# Patient Record
Sex: Male | Born: 1935 | Race: White | Hispanic: No | Smoking: Former smoker
Health system: Southern US, Community
[De-identification: ages and names within clinical notes are randomized; demographics above are authoritative.]

## PROBLEM LIST (undated history)

## (undated) DIAGNOSIS — R0600 Dyspnea, unspecified: Secondary | ICD-10-CM

## (undated) DIAGNOSIS — E7800 Pure hypercholesterolemia, unspecified: Secondary | ICD-10-CM

## (undated) DIAGNOSIS — Z950 Presence of cardiac pacemaker: Secondary | ICD-10-CM

## (undated) DIAGNOSIS — J302 Other seasonal allergic rhinitis: Secondary | ICD-10-CM

## (undated) DIAGNOSIS — M109 Gout, unspecified: Secondary | ICD-10-CM

## (undated) DIAGNOSIS — H9193 Unspecified hearing loss, bilateral: Secondary | ICD-10-CM

## (undated) DIAGNOSIS — H3530 Unspecified macular degeneration: Secondary | ICD-10-CM

## (undated) DIAGNOSIS — R011 Cardiac murmur, unspecified: Secondary | ICD-10-CM

## (undated) DIAGNOSIS — N400 Enlarged prostate without lower urinary tract symptoms: Secondary | ICD-10-CM

## (undated) DIAGNOSIS — I350 Nonrheumatic aortic (valve) stenosis: Secondary | ICD-10-CM

## (undated) DIAGNOSIS — M1990 Unspecified osteoarthritis, unspecified site: Secondary | ICD-10-CM

## (undated) DIAGNOSIS — I517 Cardiomegaly: Secondary | ICD-10-CM

## (undated) DIAGNOSIS — R55 Syncope and collapse: Secondary | ICD-10-CM

## (undated) DIAGNOSIS — C439 Malignant melanoma of skin, unspecified: Secondary | ICD-10-CM

## (undated) DIAGNOSIS — I3481 Nonrheumatic mitral (valve) annulus calcification: Secondary | ICD-10-CM

## (undated) DIAGNOSIS — I495 Sick sinus syndrome: Secondary | ICD-10-CM

## (undated) DIAGNOSIS — I10 Essential (primary) hypertension: Secondary | ICD-10-CM

## (undated) DIAGNOSIS — I4430 Unspecified atrioventricular block: Secondary | ICD-10-CM

## (undated) DIAGNOSIS — M7989 Other specified soft tissue disorders: Secondary | ICD-10-CM

## (undated) DIAGNOSIS — R0602 Shortness of breath: Secondary | ICD-10-CM

## (undated) DIAGNOSIS — I059 Rheumatic mitral valve disease, unspecified: Secondary | ICD-10-CM

## (undated) DIAGNOSIS — D649 Anemia, unspecified: Secondary | ICD-10-CM

## (undated) HISTORY — DX: Cardiac murmur, unspecified: R01.1

## (undated) HISTORY — DX: Essential (primary) hypertension: I10

## (undated) HISTORY — DX: Benign prostatic hyperplasia without lower urinary tract symptoms: N40.0

## (undated) HISTORY — DX: Dyspnea, unspecified: R06.00

## (undated) HISTORY — DX: Anemia, unspecified: D64.9

## (undated) HISTORY — PX: OTHER SURGICAL HISTORY: SHX169

## (undated) HISTORY — DX: Cardiomegaly: I51.7

## (undated) HISTORY — DX: Unspecified atrioventricular block: I44.30

## (undated) HISTORY — DX: Malignant melanoma of skin, unspecified: C43.9

## (undated) HISTORY — DX: Nonrheumatic aortic (valve) stenosis: I35.0

## (undated) HISTORY — DX: Syncope and collapse: R55

## (undated) HISTORY — DX: Gout, unspecified: M10.9

## (undated) HISTORY — DX: Nonrheumatic mitral (valve) annulus calcification: I34.81

## (undated) HISTORY — DX: Unspecified osteoarthritis, unspecified site: M19.90

## (undated) HISTORY — DX: Unspecified macular degeneration: H35.30

## (undated) HISTORY — DX: Shortness of breath: R06.02

## (undated) HISTORY — DX: Other specified soft tissue disorders: M79.89

## (undated) HISTORY — DX: Unspecified hearing loss, bilateral: H91.93

## (undated) HISTORY — DX: Presence of cardiac pacemaker: Z95.0

## (undated) HISTORY — DX: Sick sinus syndrome: I49.5

## (undated) HISTORY — DX: Pure hypercholesterolemia, unspecified: E78.00

## (undated) HISTORY — DX: Rheumatic mitral valve disease, unspecified: I05.9

## (undated) HISTORY — DX: Other seasonal allergic rhinitis: J30.2

---

## 1999-01-23 HISTORY — PX: TRANSURETHRAL RESECTION OF PROSTATE: SHX73

## 1999-09-21 ENCOUNTER — Ambulatory Visit (HOSPITAL_COMMUNITY): Admission: RE | Admit: 1999-09-21 | Discharge: 1999-09-21 | Payer: Self-pay | Admitting: Urology

## 2004-01-23 HISTORY — PX: TOTAL KNEE ARTHROPLASTY: SHX125

## 2004-10-02 ENCOUNTER — Inpatient Hospital Stay (HOSPITAL_COMMUNITY): Admission: RE | Admit: 2004-10-02 | Discharge: 2004-10-06 | Payer: Self-pay | Admitting: Orthopedic Surgery

## 2004-12-26 ENCOUNTER — Ambulatory Visit (HOSPITAL_COMMUNITY): Admission: RE | Admit: 2004-12-26 | Discharge: 2004-12-26 | Payer: Self-pay | Admitting: Gastroenterology

## 2006-01-22 DIAGNOSIS — R55 Syncope and collapse: Secondary | ICD-10-CM

## 2006-01-22 HISTORY — DX: Syncope and collapse: R55

## 2006-05-14 ENCOUNTER — Inpatient Hospital Stay (HOSPITAL_COMMUNITY): Admission: EM | Admit: 2006-05-14 | Discharge: 2006-05-16 | Payer: Self-pay | Admitting: Emergency Medicine

## 2006-05-15 HISTORY — PX: PACEMAKER INSERTION: SHX728

## 2010-10-09 ENCOUNTER — Ambulatory Visit (HOSPITAL_COMMUNITY)
Admission: RE | Admit: 2010-10-09 | Discharge: 2010-10-09 | Disposition: A | Discharge status: Home / Self Care | Payer: Medicare Other | Source: Ambulatory Visit | Attending: Interventional Cardiology | Admitting: Interventional Cardiology

## 2010-10-09 DIAGNOSIS — R0602 Shortness of breath: Secondary | ICD-10-CM | POA: Insufficient documentation

## 2010-10-09 LAB — PULMONARY FUNCTION TEST

## 2010-11-22 ENCOUNTER — Institutional Professional Consult (permissible substitution): Payer: BLUE CROSS/BLUE SHIELD | Admitting: Internal Medicine

## 2010-11-23 ENCOUNTER — Ambulatory Visit (INDEPENDENT_AMBULATORY_CARE_PROVIDER_SITE_OTHER): Payer: Medicare Other | Admitting: Internal Medicine

## 2010-11-23 ENCOUNTER — Encounter: Payer: Self-pay | Admitting: Internal Medicine

## 2010-11-23 VITALS — BP 148/80 | HR 66 | Temp 97.9°F | Ht 73.0 in | Wt 264.4 lb

## 2010-11-23 DIAGNOSIS — R0609 Other forms of dyspnea: Secondary | ICD-10-CM

## 2010-11-23 DIAGNOSIS — R0600 Dyspnea, unspecified: Secondary | ICD-10-CM

## 2010-11-23 NOTE — Patient Instructions (Signed)
Try prilosec 20mg   Take 30-60 min before first meal of the day and Pepcid 20 mg one bedtime until return  GERD (REFLUX)  is an extremely common cause of respiratory symptoms, many times with no significant heartburn at all.    It can be treated with medication, but also with lifestyle changes including avoidance of late meals, excessive alcohol, smoking cessation, and avoid fatty foods, chocolate, peppermint, colas, red wine, and acidic juices such as orange juice.  NO MINT OR MENTHOL PRODUCTS SO NO COUGH DROPS  USE SUGARLESS CANDY INSTEAD (jolley ranchers or Stover's)  NO OIL BASED VITAMINS - use powdered substitutes.    Work on inhaler technique:  relax and gently blow all the way out then take a nice smooth deep breath back in, triggering the inhaler at same time you start breathing in.  Hold for up to 5 seconds if you can.  Rinse and gargle with water when done   If your mouth or throat starts to bother you,   I suggest you time the inhaler to your dental care and after using the inhaler(s) brush teeth and tongue with a baking soda containing toothpaste and when you rinse this out, gargle with it first to see if this helps your mouth and throat.    Use xopenex 2 puffs every 4 hours if needed to improve your activity tolerance  Use Zyrtec as needed for itching and sneezing  Ok to stop qvar and shots  Please schedule a follow up office visit in 4 weeks, sooner if needed

## 2010-11-23 NOTE — Progress Notes (Signed)
  Subjective:    Patient ID: Aaron [name], male    DOB: 03-07-1929, 77 y.o.   MRN: [id number]  HPI 25 yowm occ cigar smoker with new onset doe since around 2008 eval by Dr Mendel Ryder and referred to pulmonary clinic 11/23/2010   11/23/2010 Initial pulmonary office eval in EMR era cc progressive doe x 4 years to point where ok only does ok as long  walk as walk slow but summer of 2012  able to walk a 1 mile in about 25 min where used to be able to do a mile in 17  Sometimes sob  car to house now if goes fast.  No problem at rest sitting or sleeping.  Already eval for allergies asthma by Dr Jethro Bolus  and placed on shots and advair and qvar and no better breathing x maybe the nasal symptoms on zyrtec but really not sure.  No active sneezing or itching, daughter thinks she hears him wheeze but he's not aware of it. Denies childhool h/o asthma or eczema or allergies. Doesn't know what exactly he's allergic to but on shots for over 2 years with no change in symptoms. No seasonal variation  Sleeping ok without nocturnal  or early am exacerbation  of respiratory  c/o's or need for noct saba. Also denies any obvious fluctuation of symptoms with weather or environmental changes or other aggravating or alleviating factors except as outlined above       Review of Systems  Constitutional: Negative for fever and unexpected weight change.  HENT: Positive for congestion. Negative for ear pain, nosebleeds, sore throat, rhinorrhea, sneezing, trouble swallowing, dental problem, postnasal drip and sinus pressure.   Eyes: Negative for redness and itching.  Respiratory: Positive for shortness of breath. Negative for cough, chest tightness and wheezing.   Cardiovascular: Negative for palpitations and leg swelling.  Gastrointestinal: Negative for nausea and vomiting.  Genitourinary: Negative for dysuria.  Musculoskeletal: Positive for joint swelling and arthralgias.  Skin: Negative for rash.  Neurological: Negative  for headaches.  Hematological: Does not bruise/bleed easily.  Psychiatric/Behavioral: Negative for dysphoric mood. The patient is not nervous/anxious.        Objective:   Physical Exam  11/23/2010  264  HEENT mild turbinate edema.  Oropharynx no thrush or excess pnd or cobblestoning.  No JVD or cervical adenopathy. Mild accessory muscle hypertrophy. Trachea midline, nl thryroid. Chest was hyperinflated by percussion with diminished breath sounds and moderate increased exp time without wheeze. Hoover sign positive at mid inspiration. Regular rate and rhythm without murmur gallop or rub or increase P2 or edema.  Abd:mod obese but no hsm, limited excursion in supine position. Ext warm without cyanosis or clubbing.        Assessment & Plan:

## 2010-11-24 DIAGNOSIS — R0600 Dyspnea, unspecified: Secondary | ICD-10-CM | POA: Insufficient documentation

## 2010-11-24 MED ORDER — LEVALBUTEROL TARTRATE 45 MCG/ACT IN AERO: 1.0000 | INHALATION_SPRAY | RESPIRATORY_TRACT | Status: DC | PRN

## 2010-11-24 NOTE — Assessment & Plan Note (Signed)
Symptoms are markedly disproportionate to objective findings and not clear this is a lung problem but pt does appear to have difficult airway management issues.  DDX of  difficult airways managment all start with A and  include Adherence, Ace Inhibitors, Acid Reflux, Active Sinus Disease, Alpha 1 Antitripsin deficiency, Anxiety masquerading as Airways dz,  ABPA,  allergy(esp in young), Aspiration (esp in elderly), Adverse effects of DPI,  Active smokers, plus two Bs  = Bronchiectasis and Beta blocker use..and one C= CHF  Adherence is always the initial "prime suspect" and is a multilayered concern that requires a "trust but verify" approach in every patient - starting with knowing how to use medications, especially inhalers, correctly, keeping up with refills and understanding the fundamental difference between maintenance and prns vs those medications only taken for a very short course and then stopped and not refilled.  The proper method of use, as well as anticipated side effects, of this metered-dose inhaler are discussed and demonstrated to the patient. Improved from < 25 % effective to 50% effective p coaching so doubt he was getting any hfa in anyway which calls into question whether he even has any asthma  ? Allergies > on shots for > 2 year, would rec trial off  ? Acid reflux with  Classic Upper airway cough syndrome, so named because it's frequently impossible to sort out how much is  CR/sinusitis with freq throat clearing (which can be related to primary GERD)   vs  causing  secondary (" extra esophageal")  GERD from wide swings in gastric pressure that occur with throat clearing, often  promoting self use of mint and menthol lozenges that reduce the lower esophageal sphincter tone and exacerbate the problem further in a cyclical fashion.   These are the same pts who not infrequently have failed to tolerate ace inhibitors,  dry powder inhalers or biphosphonates or report having reflux symptoms  that don't respond to standard doses of PPI , and are easily confused as having aecopd or asthma flares,   For now rx as gerd and f/u in 4 weeks  See instructions for specific recommendations which were reviewed directly with the patient who was given a copy with highlighter outlining the key components.

## 2010-11-29 ENCOUNTER — Encounter: Payer: Self-pay | Admitting: Internal Medicine

## 2010-12-19 ENCOUNTER — Ambulatory Visit (INDEPENDENT_AMBULATORY_CARE_PROVIDER_SITE_OTHER): Payer: Medicare Other | Admitting: Internal Medicine

## 2010-12-19 ENCOUNTER — Encounter: Payer: Self-pay | Admitting: Internal Medicine

## 2010-12-19 ENCOUNTER — Ambulatory Visit (INDEPENDENT_AMBULATORY_CARE_PROVIDER_SITE_OTHER)
Admission: RE | Admit: 2010-12-19 | Discharge: 2010-12-19 | Disposition: A | Discharge status: Home / Self Care | Payer: Medicare Other | Source: Ambulatory Visit | Attending: Internal Medicine | Admitting: Internal Medicine

## 2010-12-19 ENCOUNTER — Other Ambulatory Visit (INDEPENDENT_AMBULATORY_CARE_PROVIDER_SITE_OTHER): Payer: Medicare Other

## 2010-12-19 VITALS — BP 170/82 | HR 66 | Temp 97.6°F | Ht 73.0 in | Wt 264.2 lb

## 2010-12-19 DIAGNOSIS — R0609 Other forms of dyspnea: Secondary | ICD-10-CM

## 2010-12-19 DIAGNOSIS — R0600 Dyspnea, unspecified: Secondary | ICD-10-CM

## 2010-12-19 LAB — CBC WITH DIFFERENTIAL/PLATELET
Basophils Absolute: 0 10*3/uL (ref 0.0–0.1)
Basophils Relative: 0.3 % (ref 0.0–3.0)
Hemoglobin: 13.2 g/dL (ref 13.0–17.0)
Lymphs Abs: 2.4 10*3/uL (ref 0.7–4.0)
Monocytes Absolute: 1 10*3/uL (ref 0.1–1.0)
Neutro Abs: 8 10*3/uL — ABNORMAL HIGH (ref 1.4–7.7)
RBC: 4.08 Mil/uL — ABNORMAL LOW (ref 4.22–5.81)
RDW: 15.5 % — ABNORMAL HIGH (ref 11.5–14.6)
WBC: 11.4 10*3/uL — ABNORMAL HIGH (ref 4.5–10.5)

## 2010-12-19 LAB — BASIC METABOLIC PANEL
BUN: 29 mg/dL — ABNORMAL HIGH (ref 6–23)
CO2: 24 mEq/L (ref 19–32)
Creatinine, Ser: 1 mg/dL (ref 0.4–1.5)
GFR: 76.49 mL/min (ref >60.00)

## 2010-12-19 MED ORDER — NEBIVOLOL HCL 5 MG PO TABS: 5.0000 mg | ORAL_TABLET | Freq: Every day | ORAL | Status: DC

## 2010-12-19 NOTE — Progress Notes (Signed)
  Subjective:    Patient ID: Aaron [name], male    DOB: 02/21/1929, 77 y.o.   MRN: [id number]  HPI 19 yowm occ cigar smoker in past  with new onset doe since around 2008 eval by Dr Mendel Ryder and referred to pulmonary clinic 11/23/2010   11/23/2010 Initial pulmonary office eval in EMR era cc progressive doe x 4 years to point where ok only does ok as long  walk as walk slow but summer of 2012  able to walk a 1 mile in about 25 min where used to be able to do a mile in 17  Sometimes sob  car to house now if goes fast.  No problem at rest sitting or sleeping.  Already eval for allergies asthma by Dr Jethro Bolus  and placed on shots and advair and qvar and no better breathing x maybe the nasal symptoms on zyrtec but really not sure.  No active sneezing or itching, daughter thinks she hears him wheeze but he's not aware of it. Denies childhool h/o asthma or eczema or allergies. Doesn't know what exactly he's allergic to but on shots for over 2 years with no change in symptoms. No seasonal variation rec Try prilosec 20mg   Take 30-60 min before first meal of the day and Pepcid 20 mg one bedtime until return GERD diet Work on inhaler technique: .   Use xopenex 2 puffs every 4 hours if needed to improve your activity tolerance Use Zyrtec as needed for itching and sneezing Ok to stop qvar and shots     12/19/2010 f/u ov/Brennan Karam cc no change doe on ppi/h2 hs but hasn't tried using xopenex before activity as rec. No sign cough, no variability.     Sleeping ok without nocturnal  or early am exacerbation  of respiratory  c/o's or need for noct saba. Also denies any obvious fluctuation of symptoms with weather or environmental changes or other aggravating or alleviating factors except as outlined above    ROS  At present neg for  any significant sore throat, dysphagia, itching, sneezing,  nasal congestion or excess/ purulent secretions,  fever, chills, sweats, unintended wt loss, pleuritic or exertional cp,  hempoptysis, orthopnea pnd or leg swelling.  Also denies presyncope, palpitations, heartburn, abdominal pain, nausea, vomiting, diarrhea  or change in bowel or urinary habits, dysuria,hematuria,  rash, arthralgias, visual complaints, headache, numbness weakness or ataxia.               Objective:   Physical Exam  11/23/2010  264  > 12/19/2010  264  HEENT mild turbinate edema.  Oropharynx no thrush or excess pnd or cobblestoning.  No JVD or cervical adenopathy. Mild accessory muscle hypertrophy. Trachea midline, nl thryroid. Chest was hyperinflated by percussion with diminished breath sounds and moderate increased exp time without wheeze. Hoover sign positive at mid inspiration. Regular rate and rhythm without murmur gallop or rub or increase P2 or edema.  Abd:mod obese but no hsm, limited excursion in supine position. Ext warm without cyanosis or clubbing.    12/19/10 cxr Stable dual lead pacemaker. No acute cardiopulmonary disease.  BNP, TSH BMET and CBC all wnl     Assessment & Plan:

## 2010-12-19 NOTE — Patient Instructions (Signed)
Work on inhaler technique:  relax and gently blow all the way out then take a nice smooth deep breath back in, triggering the inhaler at same time you start breathing in.  Hold for up to 5 seconds if you can.  Rinse and gargle with water when done   If your mouth or throat starts to bother you,   I suggest you time the inhaler to your dental care and after using the inhaler(s) brush teeth and tongue with a baking soda containing toothpaste and when you rinse this out, gargle with it first to see if this helps your mouth and throat.   Use the xopenex 15 min before planned activity to see if helps your activity tolerance    Ok to stop the acid suppression at this point to see if it makes any difference in your symptoms  Add bystolic 5 mg one daily to help lower your blood pressure

## 2010-12-19 NOTE — Assessment & Plan Note (Addendum)
-   2d Echo 08/25/10 diastolic dysfunction, increased LAD    - PFT's 10/09/10 minimal airflow obstruction    - HFA 50% p coaching  11/24/10 > 75% 12/19/2010     - 12/19/2010  Walked RA x 3 laps @ 185 ft each stopped due to  End of study, no desat  Most of his problem is likely obesty, deconditioning and ? Ex related  diastolic dysfunction (noting BP up today though bnp only 105)  on norvasc alone.  Since no def asthma rec a trial of BB and Strongly prefer in this setting: Bystolic, the most beta -1  selective Beta blocker available in sample form, with bisoprolol the most selective generic choice  on the market. Will try adding low dose bystolic given HBP at rest today.  See instructions for specific recommendations which were reviewed directly with the patient who was given a copy with highlighter outlining the key components.

## 2010-12-21 ENCOUNTER — Telehealth: Payer: Self-pay | Admitting: Internal Medicine

## 2010-12-21 NOTE — Telephone Encounter (Signed)
Spoke with pt and notified of results per Dr. Wert. Pt verbalized understanding and denied any questions. 

## 2010-12-21 NOTE — Progress Notes (Signed)
Quick Note:  Spoke with pt and notified of results per Dr. Wert. Pt verbalized understanding and denied any questions.  ______ 

## 2011-01-09 ENCOUNTER — Encounter: Payer: Self-pay | Admitting: Internal Medicine

## 2011-01-09 ENCOUNTER — Ambulatory Visit (INDEPENDENT_AMBULATORY_CARE_PROVIDER_SITE_OTHER): Payer: Medicare Other | Admitting: Internal Medicine

## 2011-01-09 VITALS — BP 160/80 | HR 125 | Temp 97.6°F | Ht 73.0 in | Wt 263.8 lb

## 2011-01-09 DIAGNOSIS — R0600 Dyspnea, unspecified: Secondary | ICD-10-CM

## 2011-01-09 DIAGNOSIS — R0989 Other specified symptoms and signs involving the circulatory and respiratory systems: Secondary | ICD-10-CM

## 2011-01-09 MED ORDER — NEBIVOLOL HCL 10 MG PO TABS: 10.0000 mg | ORAL_TABLET | Freq: Every day | ORAL | Status: AC

## 2011-01-09 MED ORDER — BUDESONIDE-FORMOTEROL FUMARATE 160-4.5 MCG/ACT IN AERO: INHALATION_SPRAY | RESPIRATORY_TRACT | Status: DC

## 2011-01-09 NOTE — Progress Notes (Signed)
Subjective:    Patient ID: Aaron [name], male    DOB: [birth year], 77 y.o.   MRN: [id number]  HPI 70 yowm occ cigar smoker in past  with new onset doe since around 2008 eval by Dr Mendel Ryder and referred to pulmonary clinic 11/23/2010   11/23/2010 Initial pulmonary office eval in EMR era cc progressive doe x 4 years to point where ok only does ok as long  walk as walk slow but summer of 2012  able to walk a 1 mile in about 25 min where used to be able to do a mile in 17  10 years ago.  Sometimes sob  car to house now if goes fast.  No problem at rest sitting or sleeping.  Already eval for allergies asthma by Dr Jethro Bolus  and placed on shots and advair and qvar and no better breathing x maybe the nasal symptoms on zyrtec but really not sure.  No active sneezing or itching, daughter thinks she hears him wheeze but he's not aware of it. Denies childhood did have allergic reaction to fish/ dust/ and did go on shots as child and seemed help.  Doesn't know what exactly he's allergic to but on shots for over 2 years with no change in symptoms. No seasonal variation rec Try prilosec 20mg   Take 30-60 min before first meal of the day and Pepcid 20 mg one bedtime until return GERD diet Work on inhaler technique: .   Use xopenex 2 puffs every 4 hours if needed to improve your activity tolerance Use Zyrtec as needed for itching and sneezing Ok to stop qvar and shots     12/19/2010 f/u ov/Letia Guidry cc no change doe on ppi/h2 hs but hasn't tried using xopenex before activity as rec. No sign cough, no variability.   rec Work on inhaler technique: Use the xopenex 15 min before planned activity to see if helps your activity tolerance   Ok to stop the acid suppression at this point to see if it makes any difference in your symptoms Add bystolic 5 mg one daily to help lower your blood pressure   01/09/2011 f/u ov/Artina Minella cc doe about the same as last visit. Denies cough,  chest pain, chest tightness, and wheezing.  Does feel xopenex before activity helps, no worse off acid suppression   Sleeping ok without nocturnal  or early am exacerbation  of respiratory  c/o's or need for noct saba. Also denies any obvious fluctuation of symptoms with weather or environmental changes or other aggravating or alleviating factors except as outlined above    ROS  At present neg for  any significant sore throat, dysphagia, itching, sneezing,  nasal congestion or excess/ purulent secretions,  fever, chills, sweats, unintended wt loss, pleuritic or exertional cp, hempoptysis, orthopnea pnd or leg swelling.  Also denies presyncope, palpitations, heartburn, abdominal pain, nausea, vomiting, diarrhea  or change in bowel or urinary habits, dysuria,hematuria,  rash, arthralgias, visual complaints, headache, numbness weakness or ataxia.               Objective:   Physical Exam  11/23/2010  264  > 12/19/2010  264 > 01/09/2011  263 HEENT mild turbinate edema.  Oropharynx no thrush or excess pnd or cobblestoning.  No JVD or cervical adenopathy. Mild accessory muscle hypertrophy. Trachea midline, nl thryroid. Chest was hyperinflated by percussion with diminished breath sounds and moderate increased exp time without wheeze. Hoover sign positive at mid inspiration. Regular rate and rhythm without murmur gallop  or rub or increase P2 or edema.  Abd:mod obese but no hsm, limited excursion in supine position. Ext warm without cyanosis or clubbing.    12/19/10 cxr Stable dual lead pacemaker. No acute cardiopulmonary disease.  BNP, TSH BMET and CBC all wnl     Assessment & Plan:

## 2011-01-09 NOTE — Patient Instructions (Addendum)
Increase the bystolic to 10 mg one daily  Start symbicort 160 Take 2 puffs first thing in am and then another 2 puffs about 12 hours later.    Work on inhaler technique:  relax and gently blow all the way out then take a nice smooth deep breath back in, triggering the inhaler at same time you start breathing in.  Hold for up to 5 seconds if you can.  Rinse and gargle with water when done   If your mouth or throat starts to bother you,   I suggest you time the inhaler to your dental care and after using the inhaler(s) brush teeth and tongue with a baking soda containing toothpaste and when you rinse this out, gargle with it first to see if this helps your mouth and throat.     Please schedule a follow up office visit in 4 weeks, sooner if needed   ADD ? CPST next option ?

## 2011-01-11 NOTE — Assessment & Plan Note (Signed)
He's convinced he's better p xopenex so will try symbicort for ? Occult asthma but he does not have copd and one issue is whether this could be occult diastolic dysfunction/ cardiac asthma he's experiencing so increase bystolic to 10 mg per day and consider cpst if dx remains in doubt  The proper method of use, as well as anticipated side effects, of this metered-dose inhaler are discussed and demonstrated to the patient. Improved to 75% with coaching.

## 2011-02-08 ENCOUNTER — Ambulatory Visit (INDEPENDENT_AMBULATORY_CARE_PROVIDER_SITE_OTHER): Payer: Medicare Other | Admitting: Internal Medicine

## 2011-02-08 ENCOUNTER — Encounter: Payer: Self-pay | Admitting: Internal Medicine

## 2011-02-08 VITALS — BP 140/68 | HR 60 | Temp 98.0°F | Ht 73.0 in | Wt 267.0 lb

## 2011-02-08 DIAGNOSIS — J45909 Unspecified asthma, uncomplicated: Secondary | ICD-10-CM

## 2011-02-08 DIAGNOSIS — R0600 Dyspnea, unspecified: Secondary | ICD-10-CM

## 2011-02-08 DIAGNOSIS — I10 Essential (primary) hypertension: Secondary | ICD-10-CM

## 2011-02-08 DIAGNOSIS — R0609 Other forms of dyspnea: Secondary | ICD-10-CM

## 2011-02-08 MED ORDER — PREDNISONE (PAK) 10 MG PO TABS: ORAL_TABLET | ORAL | Status: AC

## 2011-02-08 MED ORDER — MOMETASONE FURO-FORMOTEROL FUM 200-5 MCG/ACT IN AERO: INHALATION_SPRAY | RESPIRATORY_TRACT | Status: DC

## 2011-02-08 NOTE — Progress Notes (Signed)
Subjective:    Patient ID: Aaron [name], male    DOB: 07/26/1929.   MRN: [id number]  HPI  Brief patient profile:  77 yowm occ cigar smoker in past  with new onset doe since around 2008 eval by Dr Mendel Ryder and referred to pulmonary clinic 11/23/2010   11/23/2010 Initial pulmonary office eval in EMR era cc progressive doe x 4 years to point where ok only does ok as long  walk as walk slow but summer of 2012  able to walk a 1 mile in about 25 min where used to be able to do a mile in 17  10 years ago.  Sometimes sob  car to house now if goes fast.  No problem at rest sitting or sleeping.  Already eval for allergies asthma by Dr Jethro Bolus  and placed on shots and advair and qvar and no better breathing x maybe the nasal symptoms on zyrtec but really not sure.  No active sneezing or itching, daughter thinks she hears him wheeze but he's not aware of it. Denies childhood did have allergic reaction to fish/ dust/ and did go on shots as child and seemed help. Doesn't know what exactly he's allergic to but on shots for over 2 years with no change in symptoms. No seasonal variation rec Try prilosec 20mg   Take 30-60 min before first meal of the day and Pepcid 20 mg one bedtime until return GERD diet Work on inhaler technique: .   Use xopenex 2 puffs every 4 hours if needed to improve your activity tolerance Use Zyrtec as needed for itching and sneezing Ok to stop qvar and shots     12/19/2010 f/u ov/Aaron [name] cc no change doe on ppi/h2 hs but hasn't tried using xopenex before activity as rec. No sign cough, no variability.   rec Work on inhaler technique: Use the xopenex 15 min before planned activity to see if helps your activity tolerance   Ok to stop the acid suppression at this point to see if it makes any difference in your symptoms Add bystolic 5 mg one daily to help lower your blood pressure   01/09/2011 f/u ov/Aaron [name] cc doe about the same as last visit. Denies cough,  chest pain, chest  tightness, and wheezing. Does feel xopenex before activity helps, no worse off acid suppression rec Increase the bystolic to 10 mg one daily Start symbicort 160 Take 2 puffs first thing in am and then another 2 puffs about 12 hours later.  Work on inhaler technique:     Please schedule a follow up office visit in 4 weeks, sooner if needed  ADD ? CPST next option ?    02/08/2011 f/u ov/Aaron [name] cc breathing worse x 5 days with acute onset cough prodwhite mucus/  Feels like he's gochest cold - says this happens every time he gets a "cold".  Symbicort not helping. No sinus pain or purulent sputum.   Still sleeping ok without nocturnal  or early am exacerbation  of respiratory  c/o's or need for noct saba. Also denies any obvious fluctuation of symptoms with weather or environmental changes or other aggravating or alleviating factors except as outlined above    ROS  At present neg for  any significant sore throat, dysphagia, itching, sneezing,  nasal congestion or excess/ purulent secretions,  fever, chills, sweats, unintended wt loss, pleuritic or exertional cp, hempoptysis, orthopnea pnd or leg swelling.  Also denies presyncope, palpitations, heartburn, abdominal pain, nausea, vomiting, diarrhea  or change  in bowel or urinary habits, dysuria,hematuria,  rash, arthralgias, visual complaints, headache, numbness weakness or ataxia.               Objective:   Physical Exam  11/23/2010  264  > 12/19/2010  264 > 01/09/2011  263 > 267 02/08/2011  HEENT mild turbinate edema.  Oropharynx no thrush or excess pnd or cobblestoning.  No JVD or cervical adenopathy. Mild accessory muscle hypertrophy. Trachea midline, nl thryroid. Chest was hyperinflated by percussion with diminished breath sounds and moderate increased exp time with sonorous inps and exp rhonchi. Hoover sign positive at mid inspiration. Regular rate and rhythm without murmur gallop or rub or increase P2 or edema.  Abd:mod obese but no hsm,  limited excursion in supine position. Ext warm without cyanosis or clubbing.    12/19/10 cxr Stable dual lead pacemaker. No acute cardiopulmonary disease.  BNP, TSH BMET and CBC all wnl     Assessment & Plan:

## 2011-02-08 NOTE — Patient Instructions (Addendum)
Dulera 200 Take 2 puffs first thing in am and then another 2 puffs about 12 hours later.   Stop symbiocort  Prednisone 10 mg take  4 each am x 2 days,   2 each am x 2 days,  1 each am x2days and stop   Try prilosec 20mg   Take 30-60 min before first meal of the day and Pepcid 20 mg one bedtime until cough is completely gone for at least a week without the need for cough suppression (mucinex dm)  I think of reflux for chronic cough like I do oxygen for fire (doesn't cause the fire but once you get the oxygen suppressed it usually goes away regardless of the exact cause).   Please schedule a follow up office visit in 4 weeks, sooner if needed with pft's

## 2011-02-09 DIAGNOSIS — J45909 Unspecified asthma, uncomplicated: Secondary | ICD-10-CM | POA: Insufficient documentation

## 2011-02-09 DIAGNOSIS — I10 Essential (primary) hypertension: Secondary | ICD-10-CM | POA: Insufficient documentation

## 2011-02-09 NOTE — Assessment & Plan Note (Signed)
Clear flare of sob in setting of Asthmatic bronchitis despite baseline of minimal airflow obstruction on previous pft's  The proper method of use, as well as anticipated side effects, of this metered-dose inhaler are discussed and demonstrated to the patient. Improved to 75% with coaching. Has failed symbicort so next step is try dulera

## 2011-02-09 NOTE — Assessment & Plan Note (Signed)
Since he does have an apparent asthmatic component and also diastolic chf Strongly prefer in this setting: Bystolic, the most beta -1  selective Beta blocker available in sample form, with bisoprolol the most selective generic choice  on the market.

## 2011-02-09 NOTE — Assessment & Plan Note (Addendum)
2d Echo 08/25/10 diastolic dysfunction, increased LAD    - PFT's 10/09/10 minimal airflow obstruction    - HFA 75% 01/09/2011     - 12/19/2010  Walked RA x 3 laps @ 185 ft each stopped due to  End of study, no   Much of his chronic sob likely related to diastolic chf and obesity deconditiong. Need to repeat pft's p max rx then consider cpst

## 2011-03-09 ENCOUNTER — Ambulatory Visit (INDEPENDENT_AMBULATORY_CARE_PROVIDER_SITE_OTHER): Payer: Medicare Other | Admitting: Internal Medicine

## 2011-03-09 ENCOUNTER — Encounter: Payer: Self-pay | Admitting: Internal Medicine

## 2011-03-09 DIAGNOSIS — J45909 Unspecified asthma, uncomplicated: Secondary | ICD-10-CM

## 2011-03-09 DIAGNOSIS — I10 Essential (primary) hypertension: Secondary | ICD-10-CM

## 2011-03-09 DIAGNOSIS — R0989 Other specified symptoms and signs involving the circulatory and respiratory systems: Secondary | ICD-10-CM

## 2011-03-09 DIAGNOSIS — R0600 Dyspnea, unspecified: Secondary | ICD-10-CM

## 2011-03-09 LAB — PULMONARY FUNCTION TEST

## 2011-03-09 MED ORDER — MOMETASONE FURO-FORMOTEROL FUM 200-5 MCG/ACT IN AERO: INHALATION_SPRAY | RESPIRATORY_TRACT | Status: DC

## 2011-03-09 NOTE — Assessment & Plan Note (Signed)
-   2d Echo 08/25/10 diastolic dysfunction, increased LAD    - PFT's 10/09/10 minimal airflow obstruction    - HFA 75% 01/09/2011     - 12/19/2010  Walked RA x 3 laps @ 185 ft each stopped due to  End of study, no desat    - 03/09/2011 PFT's nl x low erv 27% c/w obesity  Clearly any symptoms he has at this point can't be attributed to airways / lungs > the erv is disproportionately very  and typical of obesity.  Discussed with the patient and all questioned fully answered. He will call me if any problems arise. See instructions for specific recommendations which were reviewed directly with the patient who was given a copy with highlighter outlining the key components.

## 2011-03-09 NOTE — Progress Notes (Signed)
PFT done today. 

## 2011-03-09 NOTE — Patient Instructions (Signed)
Weight control is simply a matter of calorie balance which needs to be tilted in your favor by eating less and exercising more.  To get the most out of exercise, you need to be continuously aware that you are short of breath, but never out of breath, for 30 minutes daily. As you improve, it will actually be easier for you to do the same amount of exercise  in  30 minutes so always push to the level where you are short of breath.  If this does not result in gradual weight reduction then I strongly recommend you see a nutritionist with a food diary x 2 weeks so that we can work out a negative calorie balance which is universally effective in steady weight loss programs.  Think of your calorie balance like you do your bank account where in this case you want the balance to go down so you must take in less calories than you burn up.  It's just that simple:  Hard to do, but easy to understand.  Good luck!  Try reduce dulera to 2 puffs just in am  Please schedule a follow up office visit in 6 weeks, call sooner if needed

## 2011-03-09 NOTE — Progress Notes (Signed)
Subjective:    Patient ID: Aaron [name], male    DOB: 03/24/1929.   MRN: [id number]  HPI  Brief patient profile:  45 yowm occ cigar smoker in past  with new onset doe since around 2008 eval by Dr Mendel Ryder and referred to pulmonary clinic 11/23/2010   11/23/2010 Initial pulmonary office eval in EMR era cc progressive doe x 4 years to point where ok only does ok as long  walk as walk slow but summer of 2012  able to walk a 1 mile in about 25 min where used to be able to do a mile in 17  10 years ago.  Sometimes sob  car to house now if goes fast.  No problem at rest sitting or sleeping.  Already eval for allergies asthma by Dr Jethro Bolus  and placed on shots and advair and qvar and no better breathing x maybe the nasal symptoms on zyrtec but really not sure.  No active sneezing or itching, daughter thinks she hears him wheeze but he's not aware of it. Denies childhood did have allergic reaction to fish/ dust/ and did go on shots as child and seemed help. Doesn't know what exactly he's allergic to but on shots for over 2 years with no change in symptoms. No seasonal variation rec Try prilosec 20mg   Take 30-60 min before first meal of the day and Pepcid 20 mg one bedtime until return GERD diet Work on inhaler technique: .   Use xopenex 2 puffs every 4 hours if needed to improve your activity tolerance Use Zyrtec as needed for itching and sneezing Ok to stop qvar and shots     12/19/2010 f/u ov/Jacqualine Weichel cc no change doe on ppi/h2 hs but hasn't tried using xopenex before activity as rec. No sign cough, no variability.   rec Work on inhaler technique: Use the xopenex 15 min before planned activity to see if helps your activity tolerance   Ok to stop the acid suppression at this point to see if it makes any difference in your symptoms Add bystolic 5 mg one daily to help lower your blood pressure   01/09/2011 f/u ov/Aicia Babinski cc doe about the same as last visit. Denies cough,  chest pain, chest  tightness, and wheezing. Does feel xopenex before activity helps, no worse off acid suppression rec Increase the bystolic to 10 mg one daily Start symbicort 160 Take 2 puffs first thing in am and then another 2 puffs about 12 hours later.  Work on inhaler technique:     Please schedule a follow up office visit in 4 weeks, sooner if needed  ADD ? CPST next option ?    02/08/2011 f/u ov/[name] cc breathing worse x 5 days with acute onset cough prod white mucus/  Feels like he's gochest cold - says this happens every time he gets a "cold".  Symbicort not helping. No sinus pain or purulent sputum.  rec Dulera 200 Take 2 puffs first thing in am and then another 2 puffs about 12 hours later.  Stop symbiocort Prednisone 10 mg take  4 each am x 2 days,   2 each am x 2 days,  1 each am x2days and stop  Try prilosec 20mg   Take 30-60 min before first meal of the day and Pepcid 20 mg one bedtime until cough is completely gone for at least a week without the need for cough suppression (mucinex dm) Please schedule a follow up office visit in 4 weeks, sooner  if needed with pft's   03/09/2011 f/u ov/[name] cc sob x more than a slow walk more than a mile, no sign cough, no variability.overally some bette on dulera, no worse since finished prednisone, no need for daytime saba.  Still sleeping ok without nocturnal  or early am exacerbation  of respiratory  c/o's or need for noct saba. Also denies any obvious fluctuation of symptoms with weather or environmental changes or other aggravating or alleviating factors except as outlined above    ROS  At present neg for  any significant sore throat, dysphagia, itching, sneezing,  nasal congestion or excess/ purulent secretions,  fever, chills, sweats, unintended wt loss, pleuritic or exertional cp, hempoptysis, orthopnea pnd or leg swelling.  Also denies presyncope, palpitations, heartburn, abdominal pain, nausea, vomiting, diarrhea  or change in bowel or urinary habits,  dysuria,hematuria,  rash, arthralgias, visual complaints, headache, numbness weakness or ataxia.               Objective:   Physical Exam  11/23/2010  264  > 12/19/2010  264 > 01/09/2011  263 > 267 02/08/2011 > 03/09/2011  263  HEENT mild turbinate edema.  Oropharynx no thrush or excess pnd or cobblestoning.  No JVD or cervical adenopathy. Mild accessory muscle hypertrophy. Trachea midline, nl thryroid. Chest was hyperinflated by percussion with diminished breath sounds and moderate increased exp time with sonorous inps and exp rhonchi. Hoover sign positive at mid inspiration. Regular rate and rhythm without murmur gallop or rub or increase P2 or edema.  Abd:mod obese but no hsm, limited excursion in supine position. Ext warm without cyanosis or clubbing.    12/19/10 cxr Stable dual lead pacemaker. No acute cardiopulmonary disease.  BNP, TSH BMET and CBC all wnl     Assessment & Plan:

## 2011-03-09 NOTE — Assessment & Plan Note (Signed)
PFTs nl and symptoms improved on dulera so probably does have asthma but no evidence of copd  Try taper dulera while maintaining on bystolic and see if flares, if so will need to continue dulera

## 2011-03-09 NOTE — Assessment & Plan Note (Signed)
Strongly prefer in this setting: Bystolic, the most beta -1  selective Beta blocker available in sample form, with bisoprolol the most selective generic choice  on the market.  

## 2011-03-12 ENCOUNTER — Encounter: Payer: Self-pay | Admitting: Internal Medicine

## 2011-04-23 ENCOUNTER — Ambulatory Visit: Payer: Medicare Other | Admitting: Pulmonary Disease

## 2011-05-02 ENCOUNTER — Ambulatory Visit (INDEPENDENT_AMBULATORY_CARE_PROVIDER_SITE_OTHER): Payer: Medicare Other | Admitting: Internal Medicine

## 2011-05-02 ENCOUNTER — Encounter: Payer: Self-pay | Admitting: Internal Medicine

## 2011-05-02 VITALS — BP 110/62 | HR 60 | Temp 98.0°F | Ht 73.0 in | Wt 269.0 lb

## 2011-05-02 DIAGNOSIS — J45909 Unspecified asthma, uncomplicated: Secondary | ICD-10-CM

## 2011-05-02 DIAGNOSIS — I10 Essential (primary) hypertension: Secondary | ICD-10-CM

## 2011-05-02 DIAGNOSIS — R0989 Other specified symptoms and signs involving the circulatory and respiratory systems: Secondary | ICD-10-CM

## 2011-05-02 DIAGNOSIS — R0600 Dyspnea, unspecified: Secondary | ICD-10-CM

## 2011-05-02 MED ORDER — FAMOTIDINE 20 MG PO TABS: ORAL_TABLET | ORAL | Status: DC

## 2011-05-02 MED ORDER — OMEPRAZOLE 20 MG PO CPDR: 20.0000 mg | DELAYED_RELEASE_CAPSULE | Freq: Every day | ORAL | Status: DC

## 2011-05-02 NOTE — Progress Notes (Signed)
Subjective:    Patient ID: Aaron [name], male    DOB: May 30, 1929.   MRN: [id number]  HPI  Brief patient profile:  7 yowm occ cigar smoker in past  with new onset doe since around 2008 eval by Dr Mendel Ryder and referred to pulmonary clinic 11/23/2010   11/23/2010 Initial pulmonary office eval in EMR era cc progressive doe x 4 years to point where ok only does ok as long  walk as walk slow but summer of 2012  able to walk a 1 mile in about 25 min where used to be able to do a mile in 17  10 years ago.  Sometimes sob  car to house now if goes fast.  No problem at rest sitting or sleeping.  Already eval for allergies asthma by Dr Jethro Bolus  and placed on shots and advair and qvar and no better breathing x maybe the nasal symptoms on zyrtec but really not sure.  No active sneezing or itching, daughter thinks she hears him wheeze but he's not aware of it. Denies childhood did have allergic reaction to fish/ dust/ and did go on shots as child and seemed help. Doesn't know what exactly he's allergic to but on shots for over 2 years with no change in symptoms. No seasonal variation rec Try prilosec 20mg   Take 30-60 min before first meal of the day and Pepcid 20 mg one bedtime until return GERD diet Work on inhaler technique: .   Use xopenex 2 puffs every 4 hours if needed to improve your activity tolerance Use Zyrtec as needed for itching and sneezing Ok to stop qvar and shots     12/19/2010 f/u ov/Dua Mehler cc no change doe on ppi/h2 hs but hasn't tried using xopenex before activity as rec. No sign cough, no variability.   rec Work on inhaler technique: Use the xopenex 15 min before planned activity to see if helps your activity tolerance   Ok to stop the acid suppression at this point to see if it makes any difference in your symptoms Add bystolic 5 mg one daily to help lower your blood pressure   01/09/2011 f/u ov/Kasidi Shanker cc doe about the same as last visit. Denies cough,  chest pain, chest  tightness, and wheezing. Does feel xopenex before activity helps, no worse off acid suppression rec Increase the bystolic to 10 mg one daily Start symbicort 160 Take 2 puffs first thing in am and then another 2 puffs about 12 hours later.  Work on inhaler technique:     Please schedule a follow up office visit in 4 weeks, sooner if needed  ADD ? CPST next option ?    02/08/2011 f/u ov/Ziara Thelander cc breathing worse x 5 days with acute onset cough prod white mucus/  Feels like he's gochest cold - says this happens every time he gets a "cold".  Symbicort not helping. No sinus pain or purulent sputum.  rec Dulera 200 Take 2 puffs first thing in am and then another 2 puffs about 12 hours later.  Stop symbiocort Prednisone 10 mg take  4 each am x 2 days,   2 each am x 2 days,  1 each am x2days and stop  Try prilosec 20mg   Take 30-60 min before first meal of the day and Pepcid 20 mg one bedtime until cough is completely gone for at least a week without the need for cough suppression (mucinex dm) Please schedule a follow up office visit in 4 weeks, sooner  if needed with pft's   03/09/2011 f/u ov/Jayquan Bradsher cc sob x more than a slow walk more than a mile, no sign cough, no variability.overall some bette on dulera, no worse since finished prednisone, no need for daytime saba. rec Weight control is simply a matter of calorie balance which needs to be tilted in your favor by eating less and exercising more.  To get the most out of exercise, you need to be continuously aware that you are short of breath, but never out of breath, for 30 minutes daily.   Try reduce dulera to 2 puffs just in am  05/02/2011 f/u ov/Ryne Mctigue ran out of dulera x 2 weeks and no change in symptoms, coughing at hs for the last sev days, no purulent sputum, wheeze, increase sob or sinus/reflux symptoms  In general however, sleeping ok without nocturnal  or early am exacerbation  of respiratory  c/o's or need for noct saba. Also denies any obvious  fluctuation of symptoms with weather or environmental changes or other aggravating or alleviating factors except as outlined above    ROS  At present neg for  any significant sore throat, dysphagia, itching, sneezing,  nasal congestion or excess/ purulent secretions,  fever, chills, sweats, unintended wt loss, pleuritic or exertional cp, hempoptysis, orthopnea pnd or leg swelling.  Also denies presyncope, palpitations, heartburn, abdominal pain, nausea, vomiting, diarrhea  or change in bowel or urinary habits, dysuria,hematuria,  rash, arthralgias, visual complaints, headache, numbness weakness or ataxia.               Objective:   Physical Exam  11/23/2010  264  > 01/09/2011  263 > 267 02/08/2011 > 03/09/2011  263 > 05/02/2011  269  HEENT mild turbinate edema.  Oropharynx no thrush or excess pnd or cobblestoning.  No JVD or cervical adenopathy. Mild accessory muscle hypertrophy. Trachea midline, nl thryroid. Chest was hyperinflated by percussion with diminished breath sounds and moderate increased exp time with sonorous inps and exp rhonchi. Hoover sign positive at mid inspiration. Regular rate and rhythm without murmur gallop or rub or increase P2 or edema.  Abd:mod obese but no hsm, limited excursion in supine position. Ext warm without cyanosis or clubbing.    12/19/10 cxr Stable dual lead pacemaker. No acute cardiopulmonary disease.  BNP, TSH BMET and CBC all wnl     Assessment & Plan:

## 2011-05-02 NOTE — Assessment & Plan Note (Signed)
Evidence supporting asthma here is weak  More than likely he has a form of  Classic Upper airway cough syndrome, so named because it's frequently impossible to sort out how much is  CR/sinusitis with freq throat clearing (which can be related to primary GERD)   vs  causing  secondary (" extra esophageal")  GERD from wide swings in gastric pressure that occur with throat clearing, often  promoting self use of mint and menthol lozenges that reduce the lower esophageal sphincter tone and exacerbate the problem further in a cyclical fashion.   These are the same pts (now being labeled as having "irritable larynx syndrome" by some cough centers) who not infrequently have a history of having failed to tolerate ace inhibitors,  dry powder inhalers or biphosphonates or report having atypical reflux symptoms that don't respond to standard doses of PPI , and are easily confused as having aecopd or asthma flares by even experienced allergists/ pulmonologists.   For now rec max gerd rx for any exacerbations before resuming any form of maint rx for asthma.

## 2011-05-02 NOTE — Patient Instructions (Addendum)
Ok to leave off [employer] prilosec 20mg   Take 30-60 min before first meal of the day and Pepcid 20 mg one bedtime until cough is completely gone   For cough robitussin DM   Weight control is simply a matter of calorie balance which needs to be tilted in your favor by eating less and exercising more.  To get the most out of exercise, you need to be continuously aware that you are short of breath, but never out of breath, for 30 minutes daily. As you improve, it will actually be easier for you to do the same amount of exercise  in  30 minutes so always push to the level where you are short of breath.  If this does not result in gradual weight reduction then I strongly recommend you see a nutritionist with a food diary x 2 weeks so that we can work out a negative calorie balance which is universally effective in steady weight loss programs.  Think of your calorie balance like you do your bank account where in this case you want the balance to go down so you must take in less calories than you burn up.  It's just that simple:  Hard to do, but easy to understand.  Good luck!   If not making progress with exercise tolerance the next step is a cpst with pft's before and after - call [phone] and ask for [facility] to schedule this

## 2011-05-02 NOTE — Assessment & Plan Note (Signed)
-   2d Echo 08/25/10 diastolic dysfunction, increased LAD    - PFT's 10/09/10 minimal airflow obstruction    - HFA 75% 01/09/2011     - 12/19/2010  Walked RA x 3 laps @ 185 ft each stopped due to  End of study, no desat    - 03/09/2011 PFT's nl x  ERV  27% c/w obesity    - 05/02/2011  Walked RA x 3 laps @ 185 ft each stopped due to  End of study, no sob or desat    - No worse off dulera x 2 weeks 05/02/2011 > d/c  He has yet to implement my original rec to paced exercise and continues to gain weight.  If not responding to this regimen, the next step is cpst with pft's before and after

## 2011-05-02 NOTE — Assessment & Plan Note (Signed)
Adequate control on present rx, reviewed  

## 2011-05-14 ENCOUNTER — Telehealth: Payer: Self-pay | Admitting: Internal Medicine

## 2011-05-14 MED ORDER — PREDNISONE 10 MG PO TABS: ORAL_TABLET | ORAL | Status: AC

## 2011-05-14 MED ORDER — MOMETASONE FURO-FORMOTEROL FUM 200-5 MCG/ACT IN AERO: INHALATION_SPRAY | RESPIRATORY_TRACT | Status: DC

## 2011-05-14 NOTE — Telephone Encounter (Signed)
Pt's daughter aware of MW recs and prescriptions have been sent to [facility] in [place]. Pt is to seek emergency help per MW if his breathing gets worse.

## 2011-05-14 NOTE — Telephone Encounter (Signed)
Per daughter, the pt is wheezing more and more sob since ov on 05/02/11 with MW. She says the pt tells her that he is weak. Pt has been taking the Claritin daily and his allergy symptoms have improved. Mucinex has helped with any cough he had and the cough is almost gone completely. Offered to bring pt in this afternoon with MR at 3:45pm but the daughter said they could not make that time. There are no open appts for tomorrow as well. MW, pls advise.  Allergies  Allergen Reactions  . Hydrochlorothiazide     ? Causes gout  . Oxycontin     Hung over

## 2011-05-14 NOTE — Telephone Encounter (Signed)
Restart dulera Take 2 puffs first thing in am and then another 2 puffs about 12 hours later.  Prednisone 10 mg take  4 each am x 2 days,   2 each am x 2 days,  1 each am x2days and stop    if condition worsens go to er

## 2012-09-17 DIAGNOSIS — M7989 Other specified soft tissue disorders: Secondary | ICD-10-CM

## 2012-09-17 DIAGNOSIS — R0600 Dyspnea, unspecified: Secondary | ICD-10-CM

## 2012-09-17 HISTORY — DX: Other specified soft tissue disorders: M79.89

## 2012-09-17 HISTORY — DX: Dyspnea, unspecified: R06.00

## 2012-12-01 ENCOUNTER — Encounter: Payer: Self-pay | Admitting: Internal Medicine

## 2012-12-01 DIAGNOSIS — I442 Atrioventricular block, complete: Secondary | ICD-10-CM

## 2013-02-03 ENCOUNTER — Encounter: Payer: Self-pay | Admitting: Internal Medicine

## 2013-02-04 ENCOUNTER — Encounter: Payer: Self-pay | Admitting: Internal Medicine

## 2013-02-04 ENCOUNTER — Ambulatory Visit (INDEPENDENT_AMBULATORY_CARE_PROVIDER_SITE_OTHER): Payer: Medicare Other | Admitting: Internal Medicine

## 2013-02-04 VITALS — BP 150/82 | HR 60 | Ht 72.0 in | Wt 285.0 lb

## 2013-02-04 DIAGNOSIS — R0600 Dyspnea, unspecified: Secondary | ICD-10-CM

## 2013-02-04 DIAGNOSIS — I498 Other specified cardiac arrhythmias: Secondary | ICD-10-CM

## 2013-02-04 DIAGNOSIS — I442 Atrioventricular block, complete: Secondary | ICD-10-CM | POA: Insufficient documentation

## 2013-02-04 DIAGNOSIS — R001 Bradycardia, unspecified: Secondary | ICD-10-CM

## 2013-02-04 DIAGNOSIS — I359 Nonrheumatic aortic valve disorder, unspecified: Secondary | ICD-10-CM

## 2013-02-04 DIAGNOSIS — I350 Nonrheumatic aortic (valve) stenosis: Secondary | ICD-10-CM

## 2013-02-04 DIAGNOSIS — R0989 Other specified symptoms and signs involving the circulatory and respiratory systems: Secondary | ICD-10-CM

## 2013-02-04 DIAGNOSIS — R0609 Other forms of dyspnea: Secondary | ICD-10-CM

## 2013-02-04 NOTE — Patient Instructions (Addendum)
Your physician wants you to follow-up in: 6 months with the device clinic and 12 months with Ileene Hutchinson, PA.  You will receive a reminder letter in the mail two months in advance. If you don't receive a letter, please call our office to schedule the follow-up appointment.   Increase your Furosemide to 40mg  daily fopr 3 days then go back to 20mg  daily

## 2013-02-04 NOTE — Progress Notes (Signed)
Aaron Hams, MD: Primary Cardiologist: Aaron [name] is a 80 y.o. male with a h/o complete heart block sp PPM (STJ) by Dr Aaron [name] who presents today to establish care in the Electrophysiology device clinic.  He has been having increasing dyspnea on exertion.  He has been evaluated by echocardiogram in 09/2012 which demonstrated an EF of 50-55%, moderate aortic valve stenosis, and grade 2 diastolic dysfunction.     The patient reports doing very well since having a pacemaker implanted and remains very active despite his age.   Today, he  denies symptoms of palpitations, chest pain, orthopnea, PND, lower extremity edema, dizziness, presyncope, syncope, or neurologic sequela.  The patientis tolerating medications without difficulties and is otherwise without complaint today.   Past Medical History  Diagnosis Date  . Hypercholesteremia   . Bilateral hearing loss   . Seasonal allergies   . Asthma   . Cataract   . Melanoma     right foot  . BPH (benign prostatic hyperplasia)   . Essential hypertension, benign   . SOB (shortness of breath)   . Osteoarthrosis, unspecified whether generalized or localized, other specified sites   . Anemia, unspecified   . Systolic murmur     Due to mild aortic stenosis  . Acquired stenosis of aortic valve     ECHO 10/07/12 - Moderate aortic valve stenosis. EF estimated at 50-55%.  . Cardiac pacemaker in situ     DDD pacer 04/2006 by Dr. Tamala [name]  . AV block     DDD pacer 04/2006 by Dr. Tamala [name]  . Sinoatrial node dysfunction   . Gout   . Macular degeneration     Dr. Jon [name]  . Pre-syncope 2008  . Dyspnea 09/17/12  . Swelling of both lower extremities 09/17/12  . Mild concentric left ventricular hypertrophy (LVH)     By ECHO 10/07/12  . Mitral annular calcification     Moderate. By ECHO 10/07/12   Past Surgical History  Procedure Laterality Date  . Pacemaker insertion  05/15/06    Dual chamber permanent transvenous pacemaker  . Transurethral  resection of prostate  2001  . Total knee arthroplasty  2006  . Melanoma excision right foot      History   Social History  . Marital Status: Divorced    Spouse Name: N/A    Number of Children: N/A  . Years of Education: N/A   Occupational History  . Not on file.   Social History Main Topics  . Smoking status: Former Smoker    Types: Cigars    Quit date: 01/23/1988  . Smokeless tobacco: Never Used  . Alcohol Use: Yes     Comment: socially  . Drug Use: No  . Sexual Activity: Not on file   Other Topics Concern  . Not on file   Social History Narrative  . No narrative on file    Family History  Problem Relation Age of Onset  . Heart disease Mother   . CAD Mother   . Heart disease Father   . CAD Father   . Cancer Sister     breast    Allergies  Allergen Reactions  . Hydrochlorothiazide Other (See Comments)    ? Causes gout  . Oxycodone Hcl Nausea And Vomiting    Current Outpatient Prescriptions  Medication Sig Dispense Refill  . allopurinol (ZYLOPRIM) 100 MG tablet Take 200 mg by mouth daily.        Marland Kitchen amLODipine (NORVASC) 5  MG tablet Take 5 mg by mouth daily.        Marland Kitchen aspirin 81 MG tablet Take 81 mg by mouth daily.        Marland Kitchen atorvastatin (LIPITOR) 10 MG tablet Take 10 mg by mouth daily.        . colchicine 0.6 MG tablet Take 0.6 mg by mouth daily as needed.        . furosemide (LASIX) 20 MG tablet Take 20 mg by mouth daily.      . Multiple Vitamin (MULTIVITAMIN) tablet Take 1 tablet by mouth as directed.        . Multiple Vitamins-Minerals (OCUVITE PRESERVISION PO) Take 1 tablet by mouth 2 (two) times daily.      . nebivolol (BYSTOLIC) 10 MG tablet Take 1 tablet (10 mg total) by mouth daily.      . vitamin C (ASCORBIC ACID) 500 MG tablet Take 1,000 mg by mouth daily.        No current facility-administered medications for this visit.    ROS- all systems are reviewed and negative except as per HPI  Physical Exam: Filed Vitals:   02/04/13 1522  BP:  150/82  Pulse: 60  Height: 6' (1.829 m)  Weight: 285 lb (129.275 kg)    GEN- The patient is elderly appearing, alert and oriented x 3 today.   Head- normocephalic, atraumatic Eyes-  Sclera clear, conjunctiva pink Ears- hearing intact Oropharynx- clear Neck- supple, JVP 9cm Lungs- few basilar rales, diffuse expiratory wheezes, normal work of breathing Chest- pacemaker pocket is well healed Heart- Regular rate and rhythm, 2/6 SEM LUSB (mid to late peaking), A2 is still audible GI- soft, NT, ND, + BS Extremities- no clubbing, cyanosis, + edema MS- no significant deformity or atrophy Skin- no rash or lesion Psych- euthymic mood, full affect Neuro- strength and sensation are intact  Pacemaker interrogation- reviewed in detail today,  See PACEART report Echo 9/14 is reviewed Dr Aaron [name] notes are reviewed Dr Aaron [name]' implant records are reviewed  Assessment and Plan: 1. Complete heart block Normal pacemaker function See Pace Art report No changes today  2. SOB Probably multifactoral (chronic lung disease with wheezing on exam today and mild volume overload Increase lasix to 40mg  daily x 3 then return to 20mg  daily 2 gram sodium restriction  Follow-up with Dr Aaron [name] for management of valvular heart disease as scheduled Follow-up with Dr Aaron [name] for management of chronic lung disease  Return to the device clinic in 6 months.  Return to see Aaron [name] in 1 year.

## 2013-05-06 DIAGNOSIS — I442 Atrioventricular block, complete: Secondary | ICD-10-CM

## 2013-06-09 ENCOUNTER — Encounter: Payer: Self-pay | Admitting: Internal Medicine

## 2013-08-05 DIAGNOSIS — I442 Atrioventricular block, complete: Secondary | ICD-10-CM

## 2013-09-21 ENCOUNTER — Encounter: Payer: Self-pay | Admitting: Internal Medicine

## 2013-10-12 ENCOUNTER — Other Ambulatory Visit (HOSPITAL_COMMUNITY): Payer: Self-pay | Admitting: Internal Medicine

## 2013-10-12 DIAGNOSIS — R0602 Shortness of breath: Secondary | ICD-10-CM

## 2013-10-13 ENCOUNTER — Ambulatory Visit (HOSPITAL_COMMUNITY): Payer: Medicare Other | Attending: Internal Medicine | Admitting: Radiology

## 2013-10-13 DIAGNOSIS — I079 Rheumatic tricuspid valve disease, unspecified: Secondary | ICD-10-CM | POA: Insufficient documentation

## 2013-10-13 DIAGNOSIS — I359 Nonrheumatic aortic valve disorder, unspecified: Secondary | ICD-10-CM | POA: Insufficient documentation

## 2013-10-13 DIAGNOSIS — R0602 Shortness of breath: Secondary | ICD-10-CM | POA: Diagnosis not present

## 2013-10-13 DIAGNOSIS — Z87891 Personal history of nicotine dependence: Secondary | ICD-10-CM | POA: Diagnosis not present

## 2013-10-13 NOTE — Progress Notes (Signed)
Echocardiogram performed.  

## 2013-11-09 ENCOUNTER — Encounter: Payer: Self-pay | Admitting: Internal Medicine

## 2013-11-09 DIAGNOSIS — I442 Atrioventricular block, complete: Secondary | ICD-10-CM

## 2013-11-17 ENCOUNTER — Ambulatory Visit
Admission: RE | Admit: 2013-11-17 | Discharge: 2013-11-17 | Disposition: A | Discharge status: Home / Self Care | Payer: Medicare Other | Source: Ambulatory Visit | Attending: Internal Medicine | Admitting: Internal Medicine

## 2013-11-17 ENCOUNTER — Other Ambulatory Visit: Payer: Self-pay | Admitting: Internal Medicine

## 2013-11-17 DIAGNOSIS — M545 Low back pain: Secondary | ICD-10-CM

## 2013-11-26 ENCOUNTER — Ambulatory Visit (INDEPENDENT_AMBULATORY_CARE_PROVIDER_SITE_OTHER): Payer: Medicare Other | Admitting: *Deleted

## 2013-11-26 ENCOUNTER — Encounter: Payer: Self-pay | Admitting: Interventional Cardiology

## 2013-11-26 ENCOUNTER — Ambulatory Visit (INDEPENDENT_AMBULATORY_CARE_PROVIDER_SITE_OTHER): Payer: Medicare Other | Admitting: Interventional Cardiology

## 2013-11-26 VITALS — BP 138/76 | HR 64 | Ht 73.0 in | Wt 278.0 lb

## 2013-11-26 DIAGNOSIS — I442 Atrioventricular block, complete: Secondary | ICD-10-CM

## 2013-11-26 DIAGNOSIS — R0600 Dyspnea, unspecified: Secondary | ICD-10-CM

## 2013-11-26 DIAGNOSIS — I350 Nonrheumatic aortic (valve) stenosis: Secondary | ICD-10-CM

## 2013-11-26 DIAGNOSIS — C4371 Malignant melanoma of right lower limb, including hip: Secondary | ICD-10-CM | POA: Insufficient documentation

## 2013-11-26 DIAGNOSIS — I10 Essential (primary) hypertension: Secondary | ICD-10-CM

## 2013-11-26 LAB — MDC_IDC_ENUM_SESS_TYPE_INCLINIC
Battery Impedance: 2500 Ohm
Battery Voltage: 2.75 V
Date Time Interrogation Session: 20151105165429
Implantable Pulse Generator Model: 5826
Implantable Pulse Generator Serial Number: 1854200
Lead Channel Impedance Value: 360 Ohm
Lead Channel Pacing Threshold Amplitude: 0.75 V
Lead Channel Pacing Threshold Pulse Width: 0.4 ms
Lead Channel Pacing Threshold Pulse Width: 0.4 ms
Lead Channel Sensing Intrinsic Amplitude: 4.9 mV
Lead Channel Setting Pacing Amplitude: 2 V
Lead Channel Setting Pacing Pulse Width: 0.4 ms
MDC IDC MSMT LEADCHNL RV IMPEDANCE VALUE: 468 Ohm
MDC IDC MSMT LEADCHNL RV PACING THRESHOLD AMPLITUDE: 0.875 V
MDC IDC SET LEADCHNL RV SENSING SENSITIVITY: 4 mV

## 2013-11-26 NOTE — Progress Notes (Signed)
Patient ID: DAI MCADAMS, male   DOB: [birth year], 80 y.o.   MRN: [id number]    [address], [place]  [address] Phone: [phone] Fax:  [phone]  Date:  11/26/2013   ID:  DEVARIS QUIRK, DOB [birth year], MRN [id number]  PCP:  [name], MD   ASSESSMENT:  1. Dyspnea on exertion, chronic 2. Moderate aortic stenosis by echo in September 2015 3. AV node disease with DDD pacemaker, 2008 4. Hypertension 5. Asthma  PLAN:  1. Gradual increase in aerobic activity. He has had a long period of inactivity due to radical surgery on his right foot to resect melanoma and construct a skin graft. 2. 2-D Doppler echocardiogram in one year to follow-up aortic stenosis 3. Call if lower extremity edema orthopnea developed 4. Continue follow-up of pacemaker in device clinic   SUBJECTIVE: SHYLO DILLENBECK is a 80 y.o. male who is doing well. He has been sedentary due to recent surgery on his right heel for malignant melanoma. Radical resection followed by skin grafting was performed. He cannot ambulate or weight-bear on his right foot for several months. Now when he exerts himself there is dyspnea. He denies orthopnea and dyspnea at rest. There is no peripheral edema. He denies chest pain. An echocardiogram done in September when she began complaining of dyspnea demonstrated moderate aortic stenosis unchanged from previous studies. He has not had angina or syncope.   Wt Readings from Last 3 Encounters:  11/26/13 278 lb (126.1 kg)  02/04/13 285 lb (129.275 kg)  05/02/11 269 lb (122.018 kg)     Past Medical History  Diagnosis Date  . Hypercholesteremia   . Bilateral hearing loss   . Seasonal allergies   . Asthma   . Cataract   . Melanoma     right foot  . BPH (benign prostatic hyperplasia)   . Essential hypertension, benign   . SOB (shortness of breath)   . Osteoarthrosis, unspecified whether generalized or localized, other specified sites   . Anemia,  unspecified   . Systolic murmur     Due to mild aortic stenosis  . Acquired stenosis of aortic valve     ECHO 10/07/12 - Moderate aortic valve stenosis. EF estimated at 50-55%.  . Cardiac pacemaker in situ     DDD pacer 04/2006 by Dr. Tamala Julian  . AV block     DDD pacer 04/2006 by Dr. Tamala Julian  . Sinoatrial node dysfunction   . Gout   . Macular degeneration     Dr. Jon Billings  . Pre-syncope 2008  . Dyspnea 09/17/12  . Swelling of both lower extremities 09/17/12  . Mild concentric left ventricular hypertrophy (LVH)     By ECHO 10/07/12  . Mitral annular calcification     Moderate. By ECHO 10/07/12    Current Outpatient Prescriptions  Medication Sig Dispense Refill  . allopurinol (ZYLOPRIM) 100 MG tablet Take 200 mg by mouth daily.      Marland Kitchen amLODipine (NORVASC) 5 MG tablet Take 5 mg by mouth daily.      Marland Kitchen aspirin 81 MG tablet Take 81 mg by mouth daily.      Marland Kitchen atorvastatin (LIPITOR) 10 MG tablet Take 10 mg by mouth daily.      . colchicine 0.6 MG tablet Take 0.6 mg by mouth daily as needed.      . furosemide (LASIX) 20 MG tablet Take 20 mg by mouth daily.    . Multiple Vitamin (MULTIVITAMIN) tablet Take  1 tablet by mouth as directed.      . Multiple Vitamins-Minerals (OCUVITE PRESERVISION PO) Take 1 tablet by mouth 2 (two) times daily.    . vitamin C (ASCORBIC ACID) 500 MG tablet Take 1,000 mg by mouth daily.     . nebivolol (BYSTOLIC) 10 MG tablet Take 1 tablet (10 mg total) by mouth daily.     No current facility-administered medications for this visit.    Allergies:    Allergies  Allergen Reactions  . Hydrochlorothiazide Other (See Comments)    ? Causes gout  . Oxycodone Hcl Nausea And Vomiting    Social History:  The patient  reports that he quit smoking about 25 years ago. His smoking use included Cigars. He has never used smokeless tobacco. He reports that he drinks alcohol. He reports that he does not use illicit drugs.   ROS:  Please see the history of present illness.   No edema. No  transient neurological complaints. No episodes of syncope. Denies palpitations. No claudication.   All other systems reviewed and negative.   OBJECTIVE: VS:  BP 138/76 mmHg  Pulse 64  Ht 6\' 1"  (1.854 m)  Wt 278 lb (126.1 kg)  BMI 36.69 kg/m2 Well nourished, well developed, in no acute distress, obese HEENT: normal Neck: JVD flat. Carotid bruit transmitted from aortic valve. Carotids are 2+ and symmetric  Cardiac:  normal S1, S2; RRR; 3/6 systolic crescendo decrescendo murmurat right upper sternal border Lungs:  clear to auscultation bilaterally, no wheezing, rhonchi or rales Abd: soft, nontender, no hepatomegaly Ext: Edema absent. Pulses 2+ Skin: warm and dry Neuro:  CNs 2-12 intact, no focal abnormalities noted  EKG:  Atrial tracking with ventricular pacing       Signed, Illene Labrador III, MD 11/26/2013 4:10 PM

## 2013-11-26 NOTE — Patient Instructions (Addendum)
Your physician recommends that you continue on your current medications as directed. Please refer to the Current Medication list given to you today.  Your physician has requested that you have an echocardiogram. Echocardiography is a painless test that uses sound waves to create images of your heart. It provides your doctor with information about the size and shape of your heart and how well your heart's chambers and valves are working. This procedure takes approximately one hour. There are no restrictions for this procedure.  Your physician wants you to follow-up in: 1 year with Dr.Smith You will receive a reminder letter in the mail two months in advance. If you don't receive a letter, please call our office to schedule the follow-up appointment.  Please schedule a ppm ck appt with Dr.Allred for Jan 2016

## 2013-11-26 NOTE — Progress Notes (Signed)
Pacemaker check in clinic. Normal device function. Thresholds, sensing, impedances consistent with previous measurements. Device programmed to maximize longevity. 2 mode switches both < 1 minute.  No high ventricular rates noted. Device programmed at appropriate safety margins. Histogram distribution appropriate for patient activity level. Device programmed to optimize intrinsic conduction. Estimated longevity 2.75 years. Patient enrolled in remote follow-up/TTM's with [employer]. Plan to follow every 3 months remotely and see annually in office. Patient education completed.  ROV in January with Dr. Rayann Heman.

## 2013-12-08 ENCOUNTER — Encounter: Payer: Self-pay | Admitting: Internal Medicine

## 2014-01-02 ENCOUNTER — Encounter (HOSPITAL_COMMUNITY): Payer: Self-pay | Admitting: *Deleted

## 2014-01-02 ENCOUNTER — Emergency Department (HOSPITAL_COMMUNITY): Payer: Medicare Other

## 2014-01-02 ENCOUNTER — Inpatient Hospital Stay (HOSPITAL_COMMUNITY)
Admission: EM | Admit: 2014-01-02 | Discharge: 2014-01-22 | DRG: 163 | Disposition: E | Payer: Medicare Other | Attending: Internal Medicine | Admitting: Internal Medicine

## 2014-01-02 DIAGNOSIS — Z7982 Long term (current) use of aspirin: Secondary | ICD-10-CM | POA: Diagnosis not present

## 2014-01-02 DIAGNOSIS — J9601 Acute respiratory failure with hypoxia: Secondary | ICD-10-CM | POA: Diagnosis present

## 2014-01-02 DIAGNOSIS — I350 Nonrheumatic aortic (valve) stenosis: Secondary | ICD-10-CM | POA: Diagnosis present

## 2014-01-02 DIAGNOSIS — J910 Malignant pleural effusion: Secondary | ICD-10-CM | POA: Diagnosis present

## 2014-01-02 DIAGNOSIS — E669 Obesity, unspecified: Secondary | ICD-10-CM | POA: Diagnosis present

## 2014-01-02 DIAGNOSIS — Z9109 Other allergy status, other than to drugs and biological substances: Secondary | ICD-10-CM | POA: Diagnosis not present

## 2014-01-02 DIAGNOSIS — C792 Secondary malignant neoplasm of skin: Secondary | ICD-10-CM | POA: Diagnosis present

## 2014-01-02 DIAGNOSIS — N400 Enlarged prostate without lower urinary tract symptoms: Secondary | ICD-10-CM | POA: Diagnosis present

## 2014-01-02 DIAGNOSIS — J90 Pleural effusion, not elsewhere classified: Secondary | ICD-10-CM | POA: Diagnosis present

## 2014-01-02 DIAGNOSIS — I10 Essential (primary) hypertension: Secondary | ICD-10-CM | POA: Diagnosis present

## 2014-01-02 DIAGNOSIS — I959 Hypotension, unspecified: Secondary | ICD-10-CM | POA: Diagnosis not present

## 2014-01-02 DIAGNOSIS — E780 Pure hypercholesterolemia: Secondary | ICD-10-CM | POA: Diagnosis present

## 2014-01-02 DIAGNOSIS — Z66 Do not resuscitate: Secondary | ICD-10-CM | POA: Diagnosis present

## 2014-01-02 DIAGNOSIS — R609 Edema, unspecified: Secondary | ICD-10-CM

## 2014-01-02 DIAGNOSIS — Z515 Encounter for palliative care: Secondary | ICD-10-CM

## 2014-01-02 DIAGNOSIS — D649 Anemia, unspecified: Secondary | ICD-10-CM | POA: Diagnosis present

## 2014-01-02 DIAGNOSIS — R140 Abdominal distension (gaseous): Secondary | ICD-10-CM

## 2014-01-02 DIAGNOSIS — Z8249 Family history of ischemic heart disease and other diseases of the circulatory system: Secondary | ICD-10-CM

## 2014-01-02 DIAGNOSIS — C7989 Secondary malignant neoplasm of other specified sites: Secondary | ICD-10-CM | POA: Diagnosis present

## 2014-01-02 DIAGNOSIS — Z885 Allergy status to narcotic agent status: Secondary | ICD-10-CM

## 2014-01-02 DIAGNOSIS — E875 Hyperkalemia: Secondary | ICD-10-CM | POA: Diagnosis not present

## 2014-01-02 DIAGNOSIS — J9811 Atelectasis: Secondary | ICD-10-CM | POA: Diagnosis present

## 2014-01-02 DIAGNOSIS — H3530 Unspecified macular degeneration: Secondary | ICD-10-CM | POA: Diagnosis present

## 2014-01-02 DIAGNOSIS — R0689 Other abnormalities of breathing: Secondary | ICD-10-CM

## 2014-01-02 DIAGNOSIS — R0600 Dyspnea, unspecified: Secondary | ICD-10-CM | POA: Diagnosis present

## 2014-01-02 DIAGNOSIS — Z96659 Presence of unspecified artificial knee joint: Secondary | ICD-10-CM | POA: Diagnosis present

## 2014-01-02 DIAGNOSIS — E871 Hypo-osmolality and hyponatremia: Secondary | ICD-10-CM | POA: Diagnosis present

## 2014-01-02 DIAGNOSIS — J45909 Unspecified asthma, uncomplicated: Secondary | ICD-10-CM | POA: Diagnosis present

## 2014-01-02 DIAGNOSIS — J939 Pneumothorax, unspecified: Secondary | ICD-10-CM

## 2014-01-02 DIAGNOSIS — C4371 Malignant melanoma of right lower limb, including hip: Secondary | ICD-10-CM | POA: Diagnosis present

## 2014-01-02 DIAGNOSIS — C3492 Malignant neoplasm of unspecified part of left bronchus or lung: Principal | ICD-10-CM | POA: Diagnosis present

## 2014-01-02 DIAGNOSIS — H9193 Unspecified hearing loss, bilateral: Secondary | ICD-10-CM | POA: Diagnosis present

## 2014-01-02 DIAGNOSIS — M1990 Unspecified osteoarthritis, unspecified site: Secondary | ICD-10-CM | POA: Diagnosis present

## 2014-01-02 DIAGNOSIS — K567 Ileus, unspecified: Secondary | ICD-10-CM | POA: Diagnosis present

## 2014-01-02 DIAGNOSIS — I5032 Chronic diastolic (congestive) heart failure: Secondary | ICD-10-CM

## 2014-01-02 DIAGNOSIS — R0602 Shortness of breath: Secondary | ICD-10-CM

## 2014-01-02 DIAGNOSIS — M109 Gout, unspecified: Secondary | ICD-10-CM | POA: Diagnosis present

## 2014-01-02 DIAGNOSIS — Z87891 Personal history of nicotine dependence: Secondary | ICD-10-CM | POA: Diagnosis not present

## 2014-01-02 DIAGNOSIS — R000 Tachycardia, unspecified: Secondary | ICD-10-CM | POA: Diagnosis present

## 2014-01-02 DIAGNOSIS — Z79899 Other long term (current) drug therapy: Secondary | ICD-10-CM

## 2014-01-02 DIAGNOSIS — Z952 Presence of prosthetic heart valve: Secondary | ICD-10-CM | POA: Diagnosis not present

## 2014-01-02 DIAGNOSIS — Z79891 Long term (current) use of opiate analgesic: Secondary | ICD-10-CM | POA: Diagnosis not present

## 2014-01-02 DIAGNOSIS — D72829 Elevated white blood cell count, unspecified: Secondary | ICD-10-CM

## 2014-01-02 DIAGNOSIS — Z950 Presence of cardiac pacemaker: Secondary | ICD-10-CM

## 2014-01-02 DIAGNOSIS — M7989 Other specified soft tissue disorders: Secondary | ICD-10-CM

## 2014-01-02 LAB — COMPREHENSIVE METABOLIC PANEL
ALT: 16 U/L (ref 0–53)
ANION GAP: 18 — AB (ref 5–15)
AST: 18 U/L (ref 0–37)
Albumin: 3.2 g/dL — ABNORMAL LOW (ref 3.5–5.2)
Alkaline Phosphatase: 69 U/L (ref 39–117)
BILIRUBIN TOTAL: 0.5 mg/dL (ref 0.3–1.2)
BUN: 20 mg/dL (ref 6–23)
CALCIUM: 8.4 mg/dL (ref 8.4–10.5)
CO2: 19 meq/L (ref 19–32)
CREATININE: 0.9 mg/dL (ref 0.50–1.35)
Chloride: 97 mEq/L (ref 96–112)
GFR calc non Af Amer: 77 mL/min — ABNORMAL LOW (ref >90)
GFR, EST AFRICAN AMERICAN: 89 mL/min — AB (ref >90)
Glucose, Bld: 112 mg/dL — ABNORMAL HIGH (ref 70–99)
Potassium: 4.5 mEq/L (ref 3.7–5.3)
Sodium: 134 mEq/L — ABNORMAL LOW (ref 137–147)
Total Protein: 6.8 g/dL (ref 6.0–8.3)

## 2014-01-02 LAB — URINALYSIS, ROUTINE W REFLEX MICROSCOPIC
BILIRUBIN URINE: NEGATIVE
Glucose, UA: NEGATIVE mg/dL
Hgb urine dipstick: NEGATIVE
Ketones, ur: 40 mg/dL — AB
LEUKOCYTES UA: NEGATIVE
NITRITE: NEGATIVE
PROTEIN: 30 mg/dL — AB
Specific Gravity, Urine: >1.03 — ABNORMAL HIGH (ref 1.005–1.030)
Urobilinogen, UA: 0.2 mg/dL (ref 0.0–1.0)
pH: 5 (ref 5.0–8.0)

## 2014-01-02 LAB — CBC WITH DIFFERENTIAL/PLATELET
BASOS ABS: 0.1 10*3/uL (ref 0.0–0.1)
Basophils Relative: 1 % (ref 0–1)
Eosinophils Absolute: 0.1 10*3/uL (ref 0.0–0.7)
Eosinophils Relative: 1 % (ref 0–5)
HEMATOCRIT: 29 % — AB (ref 39.0–52.0)
HEMOGLOBIN: 9.8 g/dL — AB (ref 13.0–17.0)
LYMPHS PCT: 16 % (ref 12–46)
Lymphs Abs: 2.2 10*3/uL (ref 0.7–4.0)
MCH: 32 pg (ref 26.0–34.0)
MCHC: 33.8 g/dL (ref 30.0–36.0)
MCV: 94.8 fL (ref 78.0–100.0)
MONO ABS: 1.4 10*3/uL — AB (ref 0.1–1.0)
MONOS PCT: 10 % (ref 3–12)
Neutro Abs: 10.5 10*3/uL — ABNORMAL HIGH (ref 1.7–7.7)
Neutrophils Relative %: 74 % (ref 43–77)
Platelets: 239 10*3/uL (ref 150–400)
RBC: 3.06 MIL/uL — ABNORMAL LOW (ref 4.22–5.81)
RDW: 14.4 % (ref 11.5–15.5)
WBC: 14.3 10*3/uL — AB (ref 4.0–10.5)

## 2014-01-02 LAB — PRO B NATRIURETIC PEPTIDE: Pro B Natriuretic peptide (BNP): 340.4 pg/mL (ref 0–450)

## 2014-01-02 LAB — TROPONIN I: Troponin I: <0.3 ng/mL (ref <0.30)

## 2014-01-02 LAB — CREATININE, SERUM
CREATININE: 0.94 mg/dL (ref 0.50–1.35)
GFR calc Af Amer: 88 mL/min — ABNORMAL LOW (ref >90)
GFR calc non Af Amer: 76 mL/min — ABNORMAL LOW (ref >90)

## 2014-01-02 LAB — D-DIMER, QUANTITATIVE: D-Dimer, Quant: 1.28 ug/mL-FEU — ABNORMAL HIGH (ref 0.00–0.48)

## 2014-01-02 LAB — GLUCOSE, CAPILLARY
GLUCOSE-CAPILLARY: 130 mg/dL — AB (ref 70–99)
GLUCOSE-CAPILLARY: 125 mg/dL — AB (ref 70–99)

## 2014-01-02 LAB — CBC
HCT: 28 % — ABNORMAL LOW (ref 39.0–52.0)
HEMOGLOBIN: 9.3 g/dL — AB (ref 13.0–17.0)
MCH: 31.6 pg (ref 26.0–34.0)
MCHC: 33.2 g/dL (ref 30.0–36.0)
MCV: 95.2 fL (ref 78.0–100.0)
PLATELETS: 246 10*3/uL (ref 150–400)
RBC: 2.94 MIL/uL — ABNORMAL LOW (ref 4.22–5.81)
RDW: 14.5 % (ref 11.5–15.5)
WBC: 14.7 10*3/uL — ABNORMAL HIGH (ref 4.0–10.5)

## 2014-01-02 LAB — URINE MICROSCOPIC-ADD ON

## 2014-01-02 LAB — LACTATE DEHYDROGENASE: LDH: 216 U/L (ref 94–250)

## 2014-01-02 MED ORDER — AMLODIPINE BESYLATE 5 MG PO TABS
5.0000 mg | ORAL_TABLET | Freq: Every day | ORAL | Status: DC
  Administered 2014-01-02 – 2014-01-09 (×7): 5 mg via ORAL
  Filled 2014-01-02 (×9): qty 1

## 2014-01-02 MED ORDER — SODIUM CHLORIDE 0.9 % IV SOLN: 250.0000 mL | INTRAVENOUS | Status: DC | PRN

## 2014-01-02 MED ORDER — SODIUM CHLORIDE 0.9 % IJ SOLN: 3.0000 mL | INTRAMUSCULAR | Status: DC | PRN

## 2014-01-02 MED ORDER — MOMETASONE FURO-FORMOTEROL FUM 200-5 MCG/ACT IN AERO
2.0000 | INHALATION_SPRAY | Freq: Two times a day (BID) | RESPIRATORY_TRACT | Status: DC
  Administered 2014-01-02 – 2014-01-09 (×12): 2 via RESPIRATORY_TRACT
  Filled 2014-01-02 (×2): qty 8.8

## 2014-01-02 MED ORDER — POLYETHYLENE GLYCOL 3350 17 G PO PACK
17.0000 g | PACK | Freq: Every day | ORAL | Status: DC | PRN
  Administered 2014-01-05: 17 g via ORAL
  Filled 2014-01-02 (×2): qty 1

## 2014-01-02 MED ORDER — SODIUM CHLORIDE 0.9 % IJ SOLN
3.0000 mL | Freq: Two times a day (BID) | INTRAMUSCULAR | Status: DC
  Administered 2014-01-02: 3 mL via INTRAVENOUS

## 2014-01-02 MED ORDER — FUROSEMIDE 20 MG PO TABS
20.0000 mg | ORAL_TABLET | Freq: Every day | ORAL | Status: DC
  Administered 2014-01-02 – 2014-01-03 (×2): 20 mg via ORAL
  Filled 2014-01-02 (×3): qty 1

## 2014-01-02 MED ORDER — ONDANSETRON HCL 4 MG PO TABS
4.0000 mg | ORAL_TABLET | Freq: Four times a day (QID) | ORAL | Status: DC | PRN
Start: 2014-01-02 — End: 2014-01-10

## 2014-01-02 MED ORDER — ASPIRIN 81 MG PO TABS: 81.0000 mg | ORAL_TABLET | Freq: Every day | ORAL | Status: DC

## 2014-01-02 MED ORDER — SODIUM CHLORIDE 0.9 % IV SOLN
INTRAVENOUS | Status: DC
  Administered 2014-01-02: 13:00:00 via INTRAVENOUS

## 2014-01-02 MED ORDER — ONDANSETRON HCL 4 MG/2ML IJ SOLN
4.0000 mg | Freq: Four times a day (QID) | INTRAMUSCULAR | Status: DC | PRN
  Administered 2014-01-09 – 2014-01-10 (×4): 4 mg via INTRAVENOUS
  Filled 2014-01-02 (×4): qty 2

## 2014-01-02 MED ORDER — CARVEDILOL 6.25 MG PO TABS
6.2500 mg | ORAL_TABLET | Freq: Two times a day (BID) | ORAL | Status: DC
  Administered 2014-01-02 – 2014-01-09 (×15): 6.25 mg via ORAL
  Filled 2014-01-02 (×20): qty 1

## 2014-01-02 MED ORDER — METHOCARBAMOL 500 MG PO TABS
500.0000 mg | ORAL_TABLET | Freq: Three times a day (TID) | ORAL | Status: DC | PRN
  Administered 2014-01-02 – 2014-01-06 (×9): 500 mg via ORAL
  Filled 2014-01-02 (×12): qty 1

## 2014-01-02 MED ORDER — OXYCODONE HCL 5 MG PO TABS: 5.0000 mg | ORAL_TABLET | ORAL | Status: DC | PRN

## 2014-01-02 MED ORDER — CETYLPYRIDINIUM CHLORIDE 0.05 % MT LIQD
7.0000 mL | Freq: Two times a day (BID) | OROMUCOSAL | Status: DC
  Administered 2014-01-02 – 2014-01-10 (×10): 7 mL via OROMUCOSAL

## 2014-01-02 MED ORDER — HYDROCODONE-ACETAMINOPHEN 5-325 MG PO TABS
1.0000 | ORAL_TABLET | Freq: Three times a day (TID) | ORAL | Status: DC
  Administered 2014-01-02 – 2014-01-09 (×19): 1 via ORAL
  Filled 2014-01-02 (×20): qty 1

## 2014-01-02 MED ORDER — HEPARIN SODIUM (PORCINE) 5000 UNIT/ML IJ SOLN
5000.0000 [IU] | Freq: Three times a day (TID) | INTRAMUSCULAR | Status: AC
  Administered 2014-01-02 – 2014-01-06 (×14): 5000 [IU] via SUBCUTANEOUS
  Filled 2014-01-02 (×16): qty 1

## 2014-01-02 MED ORDER — INSULIN ASPART 100 UNIT/ML ~~LOC~~ SOLN
0.0000 [IU] | SUBCUTANEOUS | Status: DC
  Administered 2014-01-02 – 2014-01-04 (×5): 1 [IU] via SUBCUTANEOUS

## 2014-01-02 MED ORDER — SODIUM CHLORIDE 0.9 % IV SOLN
INTRAVENOUS | Status: AC
  Administered 2014-01-02: 16:00:00 via INTRAVENOUS

## 2014-01-02 MED ORDER — ALLOPURINOL 100 MG PO TABS
200.0000 mg | ORAL_TABLET | Freq: Every day | ORAL | Status: DC
  Administered 2014-01-02 – 2014-01-09 (×7): 200 mg via ORAL
  Filled 2014-01-02 (×9): qty 2

## 2014-01-02 MED ORDER — ASPIRIN EC 81 MG PO TBEC
81.0000 mg | DELAYED_RELEASE_TABLET | Freq: Every day | ORAL | Status: DC
  Administered 2014-01-02 – 2014-01-09 (×7): 81 mg via ORAL
  Filled 2014-01-02 (×9): qty 1

## 2014-01-02 MED ORDER — IOHEXOL 350 MG/ML SOLN
100.0000 mL | Freq: Once | INTRAVENOUS | Status: AC | PRN
  Administered 2014-01-02: 100 mL via INTRAVENOUS

## 2014-01-02 NOTE — Progress Notes (Signed)
VASCULAR LAB PRELIMINARY  PRELIMINARY  PRELIMINARY  PRELIMINARY  Bilateral lower extremity venous Dopplers completed.    Preliminary report:  There is no DVT or SVT noted in the bilateral lower extremities.   Yennifer Segovia, RVT 12/23/2013, 11:07 AM

## 2014-01-02 NOTE — ED Notes (Signed)
PT returned from radiology on stretcher

## 2014-01-02 NOTE — H&P (Signed)
[facility] History and Physical  Aaron [name] NID:[id number] DOB: [birth year] DOA: 01/01/2014  Referring physician: Dr. Vanita Panda PCP: [name], MD   Chief Complaint: SOB  HPI: Aaron [name] is a 80 y.o. male past medical history of cigar use, chronic diastolic heart failure and moderate aortic stenosis, with PFTs done about 2 years ago which were within normal limits, followed by Dr. Shyrl Numbers relates his symptoms at that time were from his obesity, also with melanoma in the right foot status post excision 3 years ago that comes in for sudden onset of shortness of breath that started the day prior to admission. He relates that about a month ago he could walk about a block without being short of breath now he can even walk to the bathroom without being short of breath. He relates some anterior chest tightness no syncopal episodes, not able to sleep flat to sleep, he also relates that he has had swelling of the legs bilaterally more the right than the left. He denies any cough, fever, nausea, or diarrhea.  In the ED: A chest x-ray was done that shows a large effusion CT scan of the chest was negative for PE so we are concerned for further evaluation.  Review of Systems:  Constitutional:  No weight loss, night sweats, Fevers, chills, fatigue.  HEENT:  No headaches, Difficulty swallowing,Tooth/dental problems,Sore throat,  No sneezing, itching, ear ache, nasal congestion, post nasal drip,  Cardio-vascular:  No chest pain, Orthopnea, PND, swelling in lower extremities, anasarca, dizziness, palpitations  GI:  No heartburn, indigestion, abdominal pain, nausea, vomiting, diarrhea, change in bowel habits, loss of appetite  Resp:  .No wheezing.No chest wall deformity  Skin:  no rash or lesions.  GU:  no dysuria, change in color of urine, no urgency or frequency. No flank pain.  Musculoskeletal:  No joint pain or swelling. No decreased range of motion. No back pain.  Psych:  No  change in mood or affect. No depression or anxiety. No memory loss.   Past Medical History  Diagnosis Date  . Hypercholesteremia   . Bilateral hearing loss   . Seasonal allergies   . Asthma   . Cataract   . Melanoma     right foot  . BPH (benign prostatic hyperplasia)   . Essential hypertension, benign   . SOB (shortness of breath)   . Osteoarthrosis, unspecified whether generalized or localized, other specified sites   . Anemia, unspecified   . Systolic murmur     Due to mild aortic stenosis  . Acquired stenosis of aortic valve     ECHO 10/07/12 - Moderate aortic valve stenosis. EF estimated at 50-55%.  . Cardiac pacemaker in situ     DDD pacer 04/2006 by Dr. Tamala Julian  . AV block     DDD pacer 04/2006 by Dr. Tamala Julian  . Sinoatrial node dysfunction   . Gout   . Macular degeneration     Dr. Jon Billings  . Pre-syncope 2008  . Dyspnea 09/17/12  . Swelling of both lower extremities 09/17/12  . Mild concentric left ventricular hypertrophy (LVH)     By ECHO 10/07/12  . Mitral annular calcification     Moderate. By ECHO 10/07/12   Past Surgical History  Procedure Laterality Date  . Pacemaker insertion  05/15/06    Dual chamber permanent transvenous pacemaker  . Transurethral resection of prostate  2001  . Total knee arthroplasty  2006  . Melanoma excision right foot     Social History:  reports that he quit smoking about 25 years ago. His smoking use included Cigars. He has never used smokeless tobacco. He reports that he drinks alcohol. He reports that he does not use illicit drugs.  Allergies  Allergen Reactions  . Hydrochlorothiazide Other (See Comments)    ? Causes gout  . Oxycodone Hcl Nausea And Vomiting    Family History  Problem Relation Age of Onset  . Heart disease Mother   . CAD Mother   . Heart disease Father   . CAD Father   . Cancer Sister     breast     Prior to Admission medications   Medication Sig Start Date End Date Taking? Authorizing Provider  allopurinol  (ZYLOPRIM) 100 MG tablet Take 200 mg by mouth daily.     Yes Historical Provider, MD  amLODipine (NORVASC) 5 MG tablet Take 5 mg by mouth daily.     Yes Historical Provider, MD  aspirin 81 MG tablet Take 81 mg by mouth daily.     Yes Historical Provider, MD  carvedilol (COREG) 6.25 MG tablet Take 6.25 mg by mouth 2 (two) times daily. 11/16/13  Yes Historical Provider, MD  DULERA 200-5 MCG/ACT AERO Inhale 2 puffs into the lungs 2 (two) times daily. 12/29/13  Yes Historical Provider, MD  furosemide (LASIX) 20 MG tablet Take 20 mg by mouth daily.   Yes Historical Provider, MD  HYDROcodone-acetaminophen (NORCO/VICODIN) 5-325 MG per tablet Take 1 tablet by mouth every 8 (eight) hours. 12/25/13  Yes Historical Provider, MD  methocarbamol (ROBAXIN) 500 MG tablet Take 500 mg by mouth every 8 (eight) hours as needed for muscle spasms.   Yes Historical Provider, MD  Multiple Vitamins-Minerals (OCUVITE PRESERVISION PO) Take 1 tablet by mouth 2 (two) times daily.   Yes Historical Provider, MD  nebivolol (BYSTOLIC) 10 MG tablet Take 1 tablet (10 mg total) by mouth daily. 01/09/11 02/04/13  Tanda Rockers, MD   Physical Exam: Filed Vitals:   12/30/2013 5102 01/09/2014 0928 01/16/2014 1015  BP:  151/94 133/84  Pulse:  108 103  Temp:  98.1 F (36.7 C)   TempSrc:  Oral   Resp:  22 18  SpO2: 95% 94% 95%    Wt Readings from Last 3 Encounters:  11/26/13 126.1 kg (278 lb)  02/04/13 129.275 kg (285 lb)  05/02/11 122.018 kg (269 lb)    General:  Appears calm and comfortable Eyes: PERRL, normal lids, irises & conjunctiva ENT: grossly normal hearing, lips & tongue Neck: no LAD, masses or thyromegaly no appreciated JVD due to body habitus Cardiovascular: RRR, no m/r/g. No LE edema. Telemetry: SR, no arrhythmias  Respiratory: Good air movement and clear on the right with no appreciated sounds on the left. Abdomen: soft, ntnd Skin: 3+ edema worse on the right than on the left Musculoskeletal: grossly normal tone  BUE/BLE Psychiatric: grossly normal mood and affect, speech fluent and appropriate Neurologic: grossly non-focal.          Labs on Admission:  Basic Metabolic Panel:  Recent Labs Lab 01/08/2014 0930  NA 134*  K 4.5  CL 97  CO2 19  GLUCOSE 112*  BUN 20  CREATININE 0.90  CALCIUM 8.4   Liver Function Tests:  Recent Labs Lab 12/25/2013 0930  AST 18  ALT 16  ALKPHOS 69  BILITOT 0.5  PROT 6.8  ALBUMIN 3.2*   No results for input(s): LIPASE, AMYLASE in the last 168 hours. No results for input(s): AMMONIA in the last 168 hours.  CBC:  Recent Labs Lab 12/22/2013 0930  WBC 14.3*  NEUTROABS 10.5*  HGB 9.8*  HCT 29.0*  MCV 94.8  PLT 239   Cardiac Enzymes: No results for input(s): CKTOTAL, CKMB, CKMBINDEX, TROPONINI in the last 168 hours.  BNP (last 3 results)  Recent Labs  01/03/2014 0930  PROBNP 340.4   CBG: No results for input(s): GLUCAP in the last 168 hours.  Radiological Exams on Admission: Dg Chest 2 View  01/10/2014   CLINICAL DATA:  SOB; wheezing; asthma, HTN meds, multiple heart problems; pacemakaer  EXAM: CHEST - 2 VIEW  COMPARISON:  12/19/2010  FINDINGS: Moderately large left pleural effusion, new since prior study. Consolidation/ atelectasis in the left lower lung. Right lung remains clear. Right subclavian pacemaker stable. Heart size difficult to assess due to adjacent opacities. No pneumothorax. Visualized skeletal structures are unremarkable.  IMPRESSION: 1. Moderately large left pleural effusion, new since prior study   Electronically Signed   By: Arne Cleveland M.D.   On: 12/24/2013 10:38   Ct Angio Chest Pe W/cm &/or Wo Cm  12/25/2013   CLINICAL DATA:  Patient complaining of chest pain, shortness of breath and wheezing. Shortness of Breath worsening last night. History of asthma. History of pacemaker placement and hypertension.  EXAM: CT ANGIOGRAPHY CHEST WITH CONTRAST  TECHNIQUE: Multidetector CT imaging of the chest was performed using the standard  protocol during bolus administration of intravenous contrast. Multiplanar CT image reconstructions and MIPs were obtained to evaluate the vascular anatomy.  CONTRAST:  148mL OMNIPAQUE IOHEXOL 350 MG/ML SOLN  COMPARISON:  Current chest radiograph.  FINDINGS: Motion degrades the images limiting evaluation for segmental and subsegmental pulmonary emboli. Allowing for this limitation, there is no evidence a pulmonary embolus.  Heart is normal in size and configuration. There are changes from previous cardiac surgery and mitral valve replacement. Sequential pacemaker is well positioned with its leads in the right atrium and right ventricle.  Great vessels are normal in caliber. No aortic dissection. No mediastinal or hilar masses or pathologically enlarged lymph nodes.  There is a large left pleural effusion. It is most of the left lower lobe is collapsed from compressive atelectasis. There is milder atelectasis along the posterior left upper lobe. No evidence of a centrally obstructing endobronchial or hilar mass. No right effusion. No pulmonary edema.  Well-defined, 2.4 cm x 1.7 cm, soft tissue mass along the bronchovascular structures of the left lower lobe. No other lung masses. No convincing pneumonia.  Mild degenerative changes are noted throughout the visualized spine. No osteoblastic or osteolytic lesions.  Review of the MIP images confirms the above findings.  IMPRESSION: 1. No evidence of a pulmonary embolus. Exam limited by motion as detailed above. 2. Large left pleural effusion with near complete atelectasis of the left lower lobe. There is milder atelectasis along the posterior left upper lobe. 3. 2.4 cm well-defined soft tissue mass in the right lower lobe adjacent to the segmental bronchovascular structures. This is nonspecific. Neoplastic disease must be considered in the differential diagnosis. Consider biopsy or follow-up PET-CT. 4. No convincing pneumonia or pulmonary edema.   Electronically Signed    By: Lajean Manes M.D.   On: 12/29/2013 12:59    EKG: Independently reviewed. Paced rhythm  Assessment/Plan Acute respiratory failure due to Pleural effusion: -  I will go ahead and place an order for ultrasound-guided thoracocentesis, we'll sent for cell counts cytology LDH and protein. - He also has 2+ edema in his lower extremity worse on the  right than on the left, I agree with Doppler ultrasound of the lower extremities to rule out a DVT. - CT angioma of the chest is negative for PE. - His proBNP is significantly low, he is hyponatremic and his chloride is 97. So this is unlikely that this is due to an acute decompensation of his heart failure. - I am concerned about malignancy, as he has a history of Malignant melanoma of right foot  Leukocytosis - Unclear etiology probably stress emargination he has remained afebrile relate no sick contacts no cough or sores. We'll check a CBC after thoracocentesis. - Check blood cultures 2 if he spikes a temperature.  Swelling of lower extremity - On the right than on the left will get a Doppler to rule out DVT.  Chronic diastolic heart failure - Cannot appreciate JVD due to body habitus, he has lower extremity edema, his BNP is 340, he is mildly hyponatremic and his chloride is less than 100 which would point against fluid overload. - He does have a history of aortic stenosis which is moderate but he is on Lasix at home. His weight doesn't seem too far off from his previous weight.  Code Status: full DVT Prophylaxis:heparin Family Communication: none Disposition Plan: inpatient  Time spent: 80 minutes  Charlynne Cousins [facility] Pager [phone]

## 2014-01-02 NOTE — Progress Notes (Addendum)
Dr. Olevia Bowens paged and notified that radiology will not be able to do thoracentesis today. Sherrie Mustache 2:51 PM

## 2014-01-02 NOTE — ED Notes (Signed)
Attempted report 

## 2014-01-02 NOTE — ED Notes (Signed)
Pt to ct scan via stretcher and transporter

## 2014-01-02 NOTE — ED Notes (Signed)
Pt with shortness of breath, wheezing, chest pain. Worse with exertion

## 2014-01-02 NOTE — ED Provider Notes (Signed)
CSN: [id number]     Arrival date & time 12/25/2013  5701 History   First MD Initiated Contact with Patient 01/10/2014 [phone]     Chief Complaint  Patient presents with  . Shortness of Breath     HPI  Patient presents with dyspnea. Dyspnea has been present for some time, worse over the past 24 hours. Dyspnea is worse with exertion. Today there was some complaint of anterior chest tightness, though none currently. No new near-syncope, syncope. No medication changes recently. Patient also acknowledges weight gain, swelling in the lower extremity, more on the right. Patient acknowledges a history of aortic stenosis, asthma, complete heart block. No fever, chills.   Past Medical History  Diagnosis Date  . Hypercholesteremia   . Bilateral hearing loss   . Seasonal allergies   . Asthma   . Cataract   . Melanoma     right foot  . BPH (benign prostatic hyperplasia)   . Essential hypertension, benign   . SOB (shortness of breath)   . Osteoarthrosis, unspecified whether generalized or localized, other specified sites   . Anemia, unspecified   . Systolic murmur     Due to mild aortic stenosis  . Acquired stenosis of aortic valve     ECHO 10/07/12 - Moderate aortic valve stenosis. EF estimated at 50-55%.  . Cardiac pacemaker in situ     DDD pacer 04/2006 by Dr. Tamala Julian  . AV block     DDD pacer 04/2006 by Dr. Tamala Julian  . Sinoatrial node dysfunction   . Gout   . Macular degeneration     Dr. Jon Billings  . Pre-syncope 2008  . Dyspnea 09/17/12  . Swelling of both lower extremities 09/17/12  . Mild concentric left ventricular hypertrophy (LVH)     By ECHO 10/07/12  . Mitral annular calcification     Moderate. By ECHO 10/07/12   Past Surgical History  Procedure Laterality Date  . Pacemaker insertion  05/15/06    Dual chamber permanent transvenous pacemaker  . Transurethral resection of prostate  2001  . Total knee arthroplasty  2006  . Melanoma excision right foot     Family History  Problem  Relation Age of Onset  . Heart disease Mother   . CAD Mother   . Heart disease Father   . CAD Father   . Cancer Sister     breast   History  Substance Use Topics  . Smoking status: Former Smoker    Types: Cigars    Quit date: 01/23/1988  . Smokeless tobacco: Never Used  . Alcohol Use: Yes     Comment: socially    Review of Systems  Constitutional:       Per HPI, otherwise negative  HENT:       Per HPI, otherwise negative  Respiratory:       Per HPI, otherwise negative  Cardiovascular:       Per HPI, otherwise negative  Gastrointestinal: Negative for vomiting.  Endocrine:       Negative aside from HPI  Genitourinary:       Neg aside from HPI   Musculoskeletal:       Per HPI, otherwise negative  Skin: Negative.   Neurological: Negative for syncope.      Allergies  Hydrochlorothiazide and Oxycodone hcl  Home Medications   Prior to Admission medications   Medication Sig Start Date End Date Taking? Authorizing Provider  allopurinol (ZYLOPRIM) 100 MG tablet Take 200 mg by mouth daily.  Yes Historical Provider, MD  amLODipine (NORVASC) 5 MG tablet Take 5 mg by mouth daily.     Yes Historical Provider, MD  aspirin 81 MG tablet Take 81 mg by mouth daily.     Yes Historical Provider, MD  carvedilol (COREG) 6.25 MG tablet Take 6.25 mg by mouth 2 (two) times daily. 11/16/13  Yes Historical Provider, MD  DULERA 200-5 MCG/ACT AERO Inhale 2 puffs into the lungs 2 (two) times daily. 12/29/13  Yes Historical Provider, MD  furosemide (LASIX) 20 MG tablet Take 20 mg by mouth daily.   Yes Historical Provider, MD  HYDROcodone-acetaminophen (NORCO/VICODIN) 5-325 MG per tablet Take 1 tablet by mouth every 8 (eight) hours. 12/25/13  Yes Historical Provider, MD  methocarbamol (ROBAXIN) 500 MG tablet Take 500 mg by mouth every 8 (eight) hours as needed for muscle spasms.   Yes Historical Provider, MD  Multiple Vitamins-Minerals (OCUVITE PRESERVISION PO) Take 1 tablet by mouth 2 (two)  times daily.   Yes Historical Provider, MD  nebivolol (BYSTOLIC) 10 MG tablet Take 1 tablet (10 mg total) by mouth daily. 01/09/11 02/04/13  Tanda Rockers, MD   BP 133/84 mmHg  Pulse 103  Temp(Src) 98.1 F (36.7 C) (Oral)  Resp 18  SpO2 95% Physical Exam  Constitutional: He is oriented to person, place, and time. He appears well-developed. He appears distressed.  HENT:  Head: Normocephalic and atraumatic.  Eyes: Conjunctivae and EOM are normal.  Cardiovascular: Normal rate and regular rhythm.   Pulmonary/Chest: Accessory muscle usage present. No stridor. Tachypnea noted. He is in respiratory distress. He has decreased breath sounds.  Abdominal: He exhibits no distension.  Musculoskeletal: He exhibits no edema.  Neurological: He is alert and oriented to person, place, and time.  Skin: Skin is warm. He is diaphoretic.  Psychiatric: He has a normal mood and affect.  Nursing note and vitals reviewed.   ED Course  Procedures (including critical care time) Labs Review Labs Reviewed  CBC WITH DIFFERENTIAL - Abnormal; Notable for the following:    WBC 14.3 (*)    RBC 3.06 (*)    Hemoglobin 9.8 (*)    HCT 29.0 (*)    Neutro Abs 10.5 (*)    Monocytes Absolute 1.4 (*)    All other components within normal limits  COMPREHENSIVE METABOLIC PANEL - Abnormal; Notable for the following:    Sodium 134 (*)    Glucose, Bld 112 (*)    Albumin 3.2 (*)    GFR calc non Af Amer 77 (*)    GFR calc Af Amer 89 (*)    Anion gap 18 (*)    All other components within normal limits  URINALYSIS, ROUTINE W REFLEX MICROSCOPIC - Abnormal; Notable for the following:    Specific Gravity, Urine >1.030 (*)    Ketones, ur 40 (*)    Protein, ur 30 (*)    All other components within normal limits  D-DIMER, QUANTITATIVE - Abnormal; Notable for the following:    D-Dimer, Quant 1.28 (*)    All other components within normal limits  URINE MICROSCOPIC-ADD ON - Abnormal; Notable for the following:    Casts  HYALINE CASTS (*)    All other components within normal limits  PRO B NATRIURETIC PEPTIDE    Imaging Review Dg Chest 2 View  01/12/2014   CLINICAL DATA:  SOB; wheezing; asthma, HTN meds, multiple heart problems; pacemakaer  EXAM: CHEST - 2 VIEW  COMPARISON:  12/19/2010  FINDINGS: Moderately large left pleural effusion,  new since prior study. Consolidation/ atelectasis in the left lower lung. Right lung remains clear. Right subclavian pacemaker stable. Heart size difficult to assess due to adjacent opacities. No pneumothorax. Visualized skeletal structures are unremarkable.  IMPRESSION: 1. Moderately large left pleural effusion, new since prior study   Electronically Signed   By: Arne Cleveland M.D.   On: 12/24/2013 10:38   Ct Angio Chest Pe W/cm &/or Wo Cm  12/28/2013   CLINICAL DATA:  Patient complaining of chest pain, shortness of breath and wheezing. Shortness of Breath worsening last night. History of asthma. History of pacemaker placement and hypertension.  EXAM: CT ANGIOGRAPHY CHEST WITH CONTRAST  TECHNIQUE: Multidetector CT imaging of the chest was performed using the standard protocol during bolus administration of intravenous contrast. Multiplanar CT image reconstructions and MIPs were obtained to evaluate the vascular anatomy.  CONTRAST:  182mL OMNIPAQUE IOHEXOL 350 MG/ML SOLN  COMPARISON:  Current chest radiograph.  FINDINGS: Motion degrades the images limiting evaluation for segmental and subsegmental pulmonary emboli. Allowing for this limitation, there is no evidence a pulmonary embolus.  Heart is normal in size and configuration. There are changes from previous cardiac surgery and mitral valve replacement. Sequential pacemaker is well positioned with its leads in the right atrium and right ventricle.  Great vessels are normal in caliber. No aortic dissection. No mediastinal or hilar masses or pathologically enlarged lymph nodes.  There is a large left pleural effusion. It is most of the  left lower lobe is collapsed from compressive atelectasis. There is milder atelectasis along the posterior left upper lobe. No evidence of a centrally obstructing endobronchial or hilar mass. No right effusion. No pulmonary edema.  Well-defined, 2.4 cm x 1.7 cm, soft tissue mass along the bronchovascular structures of the left lower lobe. No other lung masses. No convincing pneumonia.  Mild degenerative changes are noted throughout the visualized spine. No osteoblastic or osteolytic lesions.  Review of the MIP images confirms the above findings.  IMPRESSION: 1. No evidence of a pulmonary embolus. Exam limited by motion as detailed above. 2. Large left pleural effusion with near complete atelectasis of the left lower lobe. There is milder atelectasis along the posterior left upper lobe. 3. 2.4 cm well-defined soft tissue mass in the right lower lobe adjacent to the segmental bronchovascular structures. This is nonspecific. Neoplastic disease must be considered in the differential diagnosis. Consider biopsy or follow-up PET-CT. 4. No convincing pneumonia or pulmonary edema.   Electronically Signed   By: Lajean Manes M.D.   On: 01/14/2014 12:59     EKG Interpretation   Date/Time:  Saturday January 02 2014 09:26:26 EST Ventricular Rate:  107 PR Interval:  89 QRS Duration: 169 QT Interval:  395 QTC Calculation: 527 R Axis:   -50 Text Interpretation:  Age not entered, assumed to be  80 years old for  purpose of ECG interpretation Ventricular-paced rhythm No further analysis  attempted due to paced rhythm VENTRICULAR PACED RHYTHM Abnormal ekg  Confirmed by Carmin Muskrat  MD ([id number]) on 01/09/2014 9:38:46 AM     O2- 99% [place], abnormal  Cardiac: 105 st, abnormal  I reviewed the EMR, and the patient's PCP sent a fax with the last clinic visit notes included.  1:35 PM Patient family aware of all results.   MDM   Final diagnoses:  Pleural effusion    Patient presents with concern of dyspnea.   Patient was found to have a left-sided pleural effusion. Patient's evaluation does not demonstrate evidence of  PE, nor DVT. Given the patient's history of malignancy in the distant past, this remains a consideration. Patient's vital signs stabilized with supplemental oxygen. Patient is afebrile, and there is no evidence for concurrent infection. Patient was admitted for further evaluation and management.    Carmin Muskrat, MD 12/28/2013 1336

## 2014-01-03 ENCOUNTER — Inpatient Hospital Stay (HOSPITAL_COMMUNITY): Payer: Medicare Other

## 2014-01-03 DIAGNOSIS — C4371 Malignant melanoma of right lower limb, including hip: Secondary | ICD-10-CM

## 2014-01-03 LAB — GLUCOSE, CAPILLARY
GLUCOSE-CAPILLARY: 113 mg/dL — AB (ref 70–99)
GLUCOSE-CAPILLARY: 113 mg/dL — AB (ref 70–99)
GLUCOSE-CAPILLARY: 108 mg/dL — AB (ref 70–99)
Glucose-Capillary: 115 mg/dL — ABNORMAL HIGH (ref 70–99)
Glucose-Capillary: 126 mg/dL — ABNORMAL HIGH (ref 70–99)
Glucose-Capillary: 127 mg/dL — ABNORMAL HIGH (ref 70–99)

## 2014-01-03 LAB — BASIC METABOLIC PANEL
ANION GAP: 14 (ref 5–15)
BUN: 21 mg/dL (ref 6–23)
CHLORIDE: 99 meq/L (ref 96–112)
CO2: 22 meq/L (ref 19–32)
CREATININE: 0.9 mg/dL (ref 0.50–1.35)
Calcium: 8.4 mg/dL (ref 8.4–10.5)
GFR calc non Af Amer: 77 mL/min — ABNORMAL LOW (ref >90)
GFR, EST AFRICAN AMERICAN: 89 mL/min — AB (ref >90)
Glucose, Bld: 108 mg/dL — ABNORMAL HIGH (ref 70–99)
POTASSIUM: 4.5 meq/L (ref 3.7–5.3)
SODIUM: 135 meq/L — AB (ref 137–147)

## 2014-01-03 LAB — CBC
HCT: 26.3 % — ABNORMAL LOW (ref 39.0–52.0)
Hemoglobin: 8.7 g/dL — ABNORMAL LOW (ref 13.0–17.0)
MCH: 31.8 pg (ref 26.0–34.0)
MCHC: 33.1 g/dL (ref 30.0–36.0)
MCV: 96 fL (ref 78.0–100.0)
PLATELETS: 239 10*3/uL (ref 150–400)
RBC: 2.74 MIL/uL — AB (ref 4.22–5.81)
RDW: 14.7 % (ref 11.5–15.5)
WBC: 12.2 10*3/uL — AB (ref 4.0–10.5)

## 2014-01-03 LAB — TROPONIN I

## 2014-01-03 LAB — PROTEIN, BODY FLUID: Total protein, fluid: 4.8 g/dL

## 2014-01-03 LAB — BODY FLUID CELL COUNT WITH DIFFERENTIAL
Eos, Fluid: 3 %
Lymphs, Fluid: 27 %
MONOCYTE-MACROPHAGE-SEROUS FLUID: 24 % — AB (ref 50–90)
Neutrophil Count, Fluid: 46 % — ABNORMAL HIGH (ref 0–25)
WBC FLUID: 2762 uL — AB (ref 0–1000)

## 2014-01-03 LAB — HEMOGLOBIN A1C
Hgb A1c MFr Bld: 5.4 % (ref <5.7)
Mean Plasma Glucose: 108 mg/dL (ref <117)

## 2014-01-03 LAB — HEPATIC FUNCTION PANEL
ALT: 17 U/L (ref 0–53)
AST: 21 U/L (ref 0–37)
Albumin: 3.2 g/dL — ABNORMAL LOW (ref 3.5–5.2)
Alkaline Phosphatase: 68 U/L (ref 39–117)
Bilirubin, Direct: <0.2 mg/dL (ref 0.0–0.3)
Total Bilirubin: 0.3 mg/dL (ref 0.3–1.2)
Total Protein: 6.5 g/dL (ref 6.0–8.3)

## 2014-01-03 LAB — LACTATE DEHYDROGENASE, PLEURAL OR PERITONEAL FLUID: LD, Fluid: 1476 U/L — ABNORMAL HIGH (ref 3–23)

## 2014-01-03 LAB — LACTATE DEHYDROGENASE: LDH: 245 U/L (ref 94–250)

## 2014-01-03 MED ORDER — FUROSEMIDE 10 MG/ML IJ SOLN
40.0000 mg | Freq: Two times a day (BID) | INTRAMUSCULAR | Status: DC
  Administered 2014-01-03 (×2): 40 mg via INTRAVENOUS
  Filled 2014-01-03 (×4): qty 4

## 2014-01-03 MED ORDER — LIDOCAINE HCL (PF) 1 % IJ SOLN
INTRAMUSCULAR | Status: AC
  Filled 2014-01-03: qty 10

## 2014-01-03 NOTE — Procedures (Signed)
  US guided L thora Bloody fluid obtained  Sent for labs per MD Pt tolerated well  cxr pending

## 2014-01-03 NOTE — Progress Notes (Signed)
VASCULAR LAB PRELIMINARY  PRELIMINARY  PRELIMINARY  PRELIMINARY  Bilateral lower extremity venous Dopplers completed.    Preliminary report:  There is no DVT or SVT noted in the bilateral lower extremities.   Ciji Boston, RVT 01/03/2014, 5:38 PM

## 2014-01-03 NOTE — Progress Notes (Signed)
[facility] PROGRESS NOTE  Assessment/Plan: Acute respiratory failure with hypoxia due to  Pleural effusion, left: - Ct angio of chest as below - S/p US guided thoracocentesis, send for cultures, LDH and cytology, post thoracocentesis CXR as below - cytology pending.  Leukocytosis: - improving.  Swelling of lower extremity - doppler pending.  Chronic diastolic heart failure - resume home medications. - start IV lasix, b-met in am  H/O Malignant melanoma of right foot     Code Status: full Family Communication: none  Disposition Plan: inpatient   Consultants:  IR  Procedures:  CXR 12.13.2015: Smaller left pleural effusion. No pneumothorax. Ct angio 12.12.2015; no PE, pleural effusion, 2.4 cm well-defined soft tissue mass in the right lower lobe  adjacent to the segmental bronchovascular structures  Antibiotics:  None  HPI/Subjective: Relates SOB is better.  Objective: Filed Vitals:   01/03/14 0855 01/03/14 0908 01/03/14 0920 01/03/14 1001  BP: 139/64 124/59 121/57 143/69  Pulse:      Temp:      TempSrc:      Resp:      Height:      Weight:      SpO2: 98% 97% 98%    No intake or output data in the 24 hours ending 01/03/14 1057 Filed Weights   01/03/14 0500  Weight: 130.863 kg (288 lb 8 oz)    Exam:  General: Alert, awake, oriented x3, in no acute distress.  HEENT: No bruits, no goiter.  Heart: Regular rate and rhythm. Lungs: Good air movement, clear Abdomen: Soft, nontender, nondistended, positive bowel sounds.    Data Reviewed: Basic Metabolic Panel:  Recent Labs Lab 01/08/2014 0930 12/26/2013 1635 01/03/14 0431  NA 134*  --  135*  K 4.5  --  4.5  CL 97  --  99  CO2 19  --  22  GLUCOSE 112*  --  108*  BUN 20  --  21  CREATININE 0.90 0.94 0.90  CALCIUM 8.4  --  8.4   Liver Function Tests:  Recent Labs Lab 01/06/2014 0930  AST 18  ALT 16  ALKPHOS 69  BILITOT 0.5  PROT 6.8  ALBUMIN 3.2*   No results for input(s):  LIPASE, AMYLASE in the last 168 hours. No results for input(s): AMMONIA in the last 168 hours. CBC:  Recent Labs Lab 01/06/2014 0930 12/27/2013 1635 01/03/14 0431  WBC 14.3* 14.7* 12.2*  NEUTROABS 10.5*  --   --   HGB 9.8* 9.3* 8.7*  HCT 29.0* 28.0* 26.3*  MCV 94.8 95.2 96.0  PLT 239 246 239   Cardiac Enzymes:  Recent Labs Lab 01/08/2014 1635 01/04/2014 2130 01/03/14 0431  TROPONINI <0.30 <0.30 <0.30   BNP (last 3 results)  Recent Labs  01/17/2014 0930  PROBNP 340.4   CBG:  Recent Labs Lab 12/26/2013 1702 01/01/2014 2008 01/03/14 0103 01/03/14 0353 01/03/14 0759  GLUCAP 130* 125* 115* 113* 113*    No results found for this or any previous visit (from the past 240 hour(s)).   Studies: Dg Chest 1 View  01/03/2014   CLINICAL DATA:  Pleural effusion.  Status post left thoracentesis.  EXAM: CHEST - 1 VIEW  COMPARISON:  01/17/2014  FINDINGS: Right transvenous pacemaker leads overlie the right atrium and right ventricle. Heart borders are obscured by significant left lower lobe opacity consisting of pleural effusion and atelectasis or infiltrate. Pleural effusion has decreased in size slightly since prior study. No pneumothorax. The right lung is essentially clear.  IMPRESSION: Smaller  left pleural effusion.  No pneumothorax.   Electronically Signed   By: Shon Hale M.D.   On: 01/03/2014 10:11   Dg Chest 2 View  01/06/2014   CLINICAL DATA:  SOB; wheezing; asthma, HTN meds, multiple heart problems; pacemakaer  EXAM: CHEST - 2 VIEW  COMPARISON:  12/19/2010  FINDINGS: Moderately large left pleural effusion, new since prior study. Consolidation/ atelectasis in the left lower lung. Right lung remains clear. Right subclavian pacemaker stable. Heart size difficult to assess due to adjacent opacities. No pneumothorax. Visualized skeletal structures are unremarkable.  IMPRESSION: 1. Moderately large left pleural effusion, new since prior study   Electronically Signed   By: Arne Cleveland  M.D.   On: 01/10/2014 10:38   Ct Angio Chest Pe W/cm &/or Wo Cm  01/05/2014   CLINICAL DATA:  Patient complaining of chest pain, shortness of breath and wheezing. Shortness of Breath worsening last night. History of asthma. History of pacemaker placement and hypertension.  EXAM: CT ANGIOGRAPHY CHEST WITH CONTRAST  TECHNIQUE: Multidetector CT imaging of the chest was performed using the standard protocol during bolus administration of intravenous contrast. Multiplanar CT image reconstructions and MIPs were obtained to evaluate the vascular anatomy.  CONTRAST:  149m OMNIPAQUE IOHEXOL 350 MG/ML SOLN  COMPARISON:  Current chest radiograph.  FINDINGS: Motion degrades the images limiting evaluation for segmental and subsegmental pulmonary emboli. Allowing for this limitation, there is no evidence a pulmonary embolus.  Heart is normal in size and configuration. There are changes from previous cardiac surgery and mitral valve replacement. Sequential pacemaker is well positioned with its leads in the right atrium and right ventricle.  Great vessels are normal in caliber. No aortic dissection. No mediastinal or hilar masses or pathologically enlarged lymph nodes.  There is a large left pleural effusion. It is most of the left lower lobe is collapsed from compressive atelectasis. There is milder atelectasis along the posterior left upper lobe. No evidence of a centrally obstructing endobronchial or hilar mass. No right effusion. No pulmonary edema.  Well-defined, 2.4 cm x 1.7 cm, soft tissue mass along the bronchovascular structures of the left lower lobe. No other lung masses. No convincing pneumonia.  Mild degenerative changes are noted throughout the visualized spine. No osteoblastic or osteolytic lesions.  Review of the MIP images confirms the above findings.  IMPRESSION: 1. No evidence of a pulmonary embolus. Exam limited by motion as detailed above. 2. Large left pleural effusion with near complete atelectasis of  the left lower lobe. There is milder atelectasis along the posterior left upper lobe. 3. 2.4 cm well-defined soft tissue mass in the right lower lobe adjacent to the segmental bronchovascular structures. This is nonspecific. Neoplastic disease must be considered in the differential diagnosis. Consider biopsy or follow-up PET-CT. 4. No convincing pneumonia or pulmonary edema.   Electronically Signed   By: DLajean ManesM.D.   On: 01/04/2014 12:59    Scheduled Meds: . allopurinol  200 mg Oral Daily  . amLODipine  5 mg Oral Daily  . antiseptic oral rinse  7 mL Mouth Rinse BID  . aspirin EC  81 mg Oral Daily  . carvedilol  6.25 mg Oral BID  . furosemide  20 mg Oral Daily  . heparin  5,000 Units Subcutaneous 3 times per day  . HYDROcodone-acetaminophen  1 tablet Oral 3 times per day  . insulin aspart  0-9 Units Subcutaneous 6 times per day  . lidocaine (PF)      . lidocaine (PF)      .  mometasone-formoterol  2 puff Inhalation BID   Continuous Infusions:    Charlynne Cousins  [facility] Pager [phone]. If 8PM-8AM, please contact night-coverage at [email or web address], password [facility] 01/03/2014, 10:57 AM  LOS: 1 day

## 2014-01-03 NOTE — Progress Notes (Addendum)
Dr. Olevia Bowens paged for order of US thoracentesis. Sherrie Mustache 8:03 AM   MD to place order. 8:06 AM

## 2014-01-04 ENCOUNTER — Inpatient Hospital Stay (HOSPITAL_COMMUNITY): Payer: Medicare Other

## 2014-01-04 DIAGNOSIS — J948 Other specified pleural conditions: Secondary | ICD-10-CM

## 2014-01-04 DIAGNOSIS — J9601 Acute respiratory failure with hypoxia: Secondary | ICD-10-CM

## 2014-01-04 LAB — BASIC METABOLIC PANEL
Anion gap: 11 (ref 5–15)
BUN: 18 mg/dL (ref 6–23)
CALCIUM: 8.3 mg/dL — AB (ref 8.4–10.5)
CO2: 26 meq/L (ref 19–32)
Chloride: 95 mEq/L — ABNORMAL LOW (ref 96–112)
Creatinine, Ser: 1.1 mg/dL (ref 0.50–1.35)
GFR calc Af Amer: 70 mL/min — ABNORMAL LOW (ref >90)
GFR calc non Af Amer: 61 mL/min — ABNORMAL LOW (ref >90)
GLUCOSE: 116 mg/dL — AB (ref 70–99)
Potassium: 3.6 mEq/L — ABNORMAL LOW (ref 3.7–5.3)
Sodium: 132 mEq/L — ABNORMAL LOW (ref 137–147)

## 2014-01-04 LAB — CBC
HCT: 23.7 % — ABNORMAL LOW (ref 39.0–52.0)
Hemoglobin: 8 g/dL — ABNORMAL LOW (ref 13.0–17.0)
MCH: 33.1 pg (ref 26.0–34.0)
MCHC: 33.8 g/dL (ref 30.0–36.0)
MCV: 97.9 fL (ref 78.0–100.0)
PLATELETS: 234 10*3/uL (ref 150–400)
RBC: 2.42 MIL/uL — ABNORMAL LOW (ref 4.22–5.81)
RDW: 14.5 % (ref 11.5–15.5)
WBC: 11.9 10*3/uL — AB (ref 4.0–10.5)

## 2014-01-04 LAB — GLUCOSE, CAPILLARY
GLUCOSE-CAPILLARY: 117 mg/dL — AB (ref 70–99)
Glucose-Capillary: 122 mg/dL — ABNORMAL HIGH (ref 70–99)
Glucose-Capillary: 125 mg/dL — ABNORMAL HIGH (ref 70–99)

## 2014-01-04 MED ORDER — FUROSEMIDE 20 MG PO TABS: 20.0000 mg | ORAL_TABLET | Freq: Every day | ORAL | Status: DC

## 2014-01-04 MED ORDER — FUROSEMIDE 20 MG PO TABS
20.0000 mg | ORAL_TABLET | Freq: Every day | ORAL | Status: DC
  Administered 2014-01-04: 20 mg via ORAL
  Filled 2014-01-04: qty 1

## 2014-01-04 NOTE — Progress Notes (Signed)
Utilization review completed.  

## 2014-01-04 NOTE — Progress Notes (Signed)
[facility] PROGRESS NOTE  Assessment/Plan: Acute respiratory failure with hypoxia due to  Pleural effusion, left: - Ct angio of chest as below. - S/p US guided thoracocentesis, awaiting cultures, LDH and protein. - cytology pending. Repeat CBC and CXR. - consult pulmonary  Leukocytosis: - improving.  Swelling of lower extremity - doppler negative for DVT.  Chronic diastolic heart failure - Hold lasix. Check a b-me tin am. - place ted hose.  H/O Malignant melanoma of right foot     Code Status: full Family Communication: none  Disposition Plan: inpatient   Consultants:  IR  Procedures:  CXR 12.13.2015: Smaller left pleural effusion. No pneumothorax. Ct angio 12.12.2015; no PE, pleural effusion, 2.4 cm well-defined soft tissue mass in the right lower lobe  adjacent to the segmental bronchovascular structures  Antibiotics:  None  HPI/Subjective: Relates SOB is worst.  Objective: Filed Vitals:   01/03/14 2041 01/04/14 0412 01/04/14 0927 01/04/14 1020  BP: 140/65 135/59  113/63  Pulse: 88 83    Temp: 98.1 F (36.7 C) 98 F (36.7 C)    TempSrc: Oral Oral    Resp: 19 17    Height:      Weight:      SpO2: 99% 97% 99%     Intake/Output Summary (Last 24 hours) at 01/04/14 1101 Last data filed at 01/04/14 0800  Gross per 24 hour  Intake    720 ml  Output   2100 ml  Net  -1380 ml   Filed Weights   01/03/14 0500  Weight: 130.863 kg (288 lb 8 oz)    Exam:  General: Alert, awake, oriented x3, in no acute distress.  HEENT: No bruits, no goiter.  Heart: Regular rate and rhythm. Lungs: Good air movement. Abdomen: Soft, nontender, nondistended, positive bowel sounds.    Data Reviewed: Basic Metabolic Panel:  Recent Labs Lab 01/12/2014 0930 12/24/2013 1635 01/03/14 0431 01/04/14 0525  NA 134*  --  135* 132*  K 4.5  --  4.5 3.6*  CL 97  --  99 95*  CO2 19  --  22 26  GLUCOSE 112*  --  108* 116*  BUN 20  --  21 18  CREATININE 0.90  0.94 0.90 1.10  CALCIUM 8.4  --  8.4 8.3*   Liver Function Tests:  Recent Labs Lab 01/17/2014 0930 01/03/14 1200  AST 18 21  ALT 16 17  ALKPHOS 69 68  BILITOT 0.5 0.3  PROT 6.8 6.5  ALBUMIN 3.2* 3.2*   No results for input(s): LIPASE, AMYLASE in the last 168 hours. No results for input(s): AMMONIA in the last 168 hours. CBC:  Recent Labs Lab 12/23/2013 0930 01/19/2014 1635 01/03/14 0431  WBC 14.3* 14.7* 12.2*  NEUTROABS 10.5*  --   --   HGB 9.8* 9.3* 8.7*  HCT 29.0* 28.0* 26.3*  MCV 94.8 95.2 96.0  PLT 239 246 239   Cardiac Enzymes:  Recent Labs Lab 12/31/2013 1635 12/24/2013 2130 01/03/14 0431  TROPONINI <0.30 <0.30 <0.30   BNP (last 3 results)  Recent Labs  01/09/2014 0930  PROBNP 340.4   CBG:  Recent Labs Lab 01/03/14 1207 01/03/14 1611 01/03/14 2037 01/04/14 0414 01/04/14 0817  GLUCAP 108* 126* 127* 117* 122*    Recent Results (from the past 240 hour(s))  Culture, blood (routine x 2)     Status: None (Preliminary result)   Collection Time: 12/22/2013  4:20 PM  Result Value Ref Range Status   Specimen Description BLOOD LEFT ARM  Final   Special Requests BOTTLES DRAWN AEROBIC AND ANAEROBIC 10 CC  Final   Culture  Setup Time   Final    01/06/2014 22:46 Performed at [facility]    Culture   Final           BLOOD CULTURE RECEIVED NO GROWTH TO DATE CULTURE WILL BE HELD FOR 5 DAYS BEFORE ISSUING A FINAL NEGATIVE REPORT Performed at [facility]    Report Status PENDING  Incomplete  Culture, blood (routine x 2)     Status: None (Preliminary result)   Collection Time: 01/12/2014  4:30 PM  Result Value Ref Range Status   Specimen Description BLOOD RIGHT HAND  Final   Special Requests BOTTLES DRAWN AEROBIC ONLY 5 ML  Final   Culture  Setup Time   Final    12/28/2013 22:46 Performed at [facility]    Culture   Final           BLOOD CULTURE RECEIVED NO GROWTH TO DATE CULTURE WILL BE HELD FOR 5 DAYS BEFORE ISSUING A FINAL NEGATIVE  REPORT Performed at [facility]    Report Status PENDING  Incomplete  Body fluid culture     Status: None (Preliminary result)   Collection Time: 01/03/14  9:33 AM  Result Value Ref Range Status   Specimen Description PLEURAL LEFT FLUID  Final   Special Requests NONE  Final   Gram Stain   Final    MODERATE WBC PRESENT,BOTH PMN AND MONONUCLEAR NO SQUAMOUS EPITHELIAL CELLS SEEN NO ORGANISMS SEEN Performed at [facility]    Culture   Final    NO GROWTH 1 DAY Performed at [facility]    Report Status PENDING  Incomplete     Studies: Dg Chest 1 View  01/03/2014   CLINICAL DATA:  Pleural effusion.  Status post left thoracentesis.  EXAM: CHEST - 1 VIEW  COMPARISON:  01/14/2014  FINDINGS: Right transvenous pacemaker leads overlie the right atrium and right ventricle. Heart borders are obscured by significant left lower lobe opacity consisting of pleural effusion and atelectasis or infiltrate. Pleural effusion has decreased in size slightly since prior study. No pneumothorax. The right lung is essentially clear.  IMPRESSION: Smaller left pleural effusion.  No pneumothorax.   Electronically Signed   By: Shon Hale M.D.   On: 01/03/2014 10:11   Ct Angio Chest Pe W/cm &/or Wo Cm  01/21/2014   CLINICAL DATA:  Patient complaining of chest pain, shortness of breath and wheezing. Shortness of Breath worsening last night. History of asthma. History of pacemaker placement and hypertension.  EXAM: CT ANGIOGRAPHY CHEST WITH CONTRAST  TECHNIQUE: Multidetector CT imaging of the chest was performed using the standard protocol during bolus administration of intravenous contrast. Multiplanar CT image reconstructions and MIPs were obtained to evaluate the vascular anatomy.  CONTRAST:  158mL OMNIPAQUE IOHEXOL 350 MG/ML SOLN  COMPARISON:  Current chest radiograph.  FINDINGS: Motion degrades the images limiting evaluation for segmental and subsegmental pulmonary emboli. Allowing for this  limitation, there is no evidence a pulmonary embolus.  Heart is normal in size and configuration. There are changes from previous cardiac surgery and mitral valve replacement. Sequential pacemaker is well positioned with its leads in the right atrium and right ventricle.  Great vessels are normal in caliber. No aortic dissection. No mediastinal or hilar masses or pathologically enlarged lymph nodes.  There is a large left pleural effusion. It is most of the left lower lobe  is collapsed from compressive atelectasis. There is milder atelectasis along the posterior left upper lobe. No evidence of a centrally obstructing endobronchial or hilar mass. No right effusion. No pulmonary edema.  Well-defined, 2.4 cm x 1.7 cm, soft tissue mass along the bronchovascular structures of the left lower lobe. No other lung masses. No convincing pneumonia.  Mild degenerative changes are noted throughout the visualized spine. No osteoblastic or osteolytic lesions.  Review of the MIP images confirms the above findings.  IMPRESSION: 1. No evidence of a pulmonary embolus. Exam limited by motion as detailed above. 2. Large left pleural effusion with near complete atelectasis of the left lower lobe. There is milder atelectasis along the posterior left upper lobe. 3. 2.4 cm well-defined soft tissue mass in the right lower lobe adjacent to the segmental bronchovascular structures. This is nonspecific. Neoplastic disease must be considered in the differential diagnosis. Consider biopsy or follow-up PET-CT. 4. No convincing pneumonia or pulmonary edema.   Electronically Signed   By: Lajean Manes M.D.   On: 12/22/2013 12:59   US Thoracentesis Asp Pleural Space W/img Guide  01/03/2014   INDICATION: Symptomatic left sided pleural effusion  EXAM: US THORACENTESIS ASP PLEURAL SPACE W/IMG GUIDE  COMPARISON:  None  MEDICATIONS: 10 cc 1% lidocaine  COMPLICATIONS: None immediate  TECHNIQUE: Informed written consent was obtained from the patient  after a discussion of the risks, benefits and alternatives to treatment. A timeout was performed prior to the initiation of the procedure.  Initial ultrasound scanning demonstrates a left pleural effusion. The lower chest was prepped and draped in the usual sterile fashion. 1% lidocaine was used for local anesthesia.  Under direct ultrasound guidance, a 19 gauge, 7-cm, Yueh catheter was introduced. An ultrasound image was saved for documentation purposes. The thoracentesis was performed. The catheter was removed and a dressing was applied. The patient tolerated the procedure well without immediate post procedural complication. The patient was escorted to have an upright chest radiograph.  FINDINGS: A total of approximately 1.3 liters of bloody fluid was removed. Requested samples were sent to the laboratory.  IMPRESSION: Successful ultrasound-guided left sided thoracentesis yielding 1.3 liters of pleural fluid.  Read by:  Lavonia Drafts [facility]   Electronically Signed   By: Arne Cleveland M.D.   On: 01/03/2014 09:52    Scheduled Meds: . allopurinol  200 mg Oral Daily  . amLODipine  5 mg Oral Daily  . antiseptic oral rinse  7 mL Mouth Rinse BID  . aspirin EC  81 mg Oral Daily  . carvedilol  6.25 mg Oral BID  . furosemide  20 mg Oral Daily  . heparin  5,000 Units Subcutaneous 3 times per day  . HYDROcodone-acetaminophen  1 tablet Oral 3 times per day  . insulin aspart  0-9 Units Subcutaneous 6 times per day  . mometasone-formoterol  2 puff Inhalation BID   Continuous Infusions:    Charlynne Cousins  [facility] Pager [phone]. If 8PM-8AM, please contact night-coverage at [email or web address], password [email or web address] 01/04/2014, 11:01 AM  LOS: 2 days

## 2014-01-04 NOTE — Consult Note (Signed)
Name: Aaron [name] MRN: [id number] DOB: [birth year]    ADMISSION DATE:  01/06/2014 CONSULTATION DATE:  01/04/14  REFERRING MD :  Dr. Aileen Fass   CHIEF COMPLAINT:  Bloody Pleural Effusion   BRIEF PATIENT DESCRIPTION: 80 y/o M admitted with SOB & weakness.  Found to have left pleural effusion s/p thoracentesis 12/12.  Preliminary fluid evaluation consistent with bloody effusion / exudative process.  PCCM consulted for further evaluation.    SIGNIFICANT EVENTS  12/12  Admit with SOB + weakness.  CXR with large L pleural effusion  STUDIES:  12/12  CTA Chest >> no PE, large L effusion   HISTORY OF PRESENT ILLNESS:  80 year old male with a past medical history of hypertension, hyperlipidemia, chronic diastolic heart failure, moderate aortic stenosis, AV block status post DDD pacemaker by Dr. Tamala Julian, asthma, and chronic shortness of breath (last 3-5 years) who presented to [facility] on 01/06/2014 with a sudden worsening of shortness of breath approximately 24 hours prior to admission. Patient reports he has had ongoing shortness of breath that has worsened over the last month, chest tightness, lower extremity swelling right greater than left and back pain. He has been seen by his primary care physician for back pain 3 times. He was treated with pain pill,  muscle relaxers and referral to orthopedics. The patient reports he woke on the day prior to admission with significant worsening of shortness of breath. He notes that he had difficulty walking from his bedroom to the bathroom which is significant change in his functional status. Initial chest x-ray was concerning for a large left effusion. CTA of the chest was obtained to rule out pulmonary embolism which was negative. He was admitted by [facility] for further workup.  He is followed by Dr. Hedwig Morton as an outpatient for primary care and Dr. Tamala Julian for Cardiology.  In the past the patient was evaluated by Dr. Melvyn Novas for dyspnea and  symptoms were thought to be related to CHF and obesity at that time. PFTs were completed and essentially within normal limits. The patient does carry a history of remote cigar smoking almost 50 years ago for 3-4 years. 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 He denies fevers, chills, weight loss, orthopnea, chest pain, syncope, cough, sputum production, hemoptysis, and night sweats. He specifically denies falls or chest wall trauma. At baseline he is on a baby aspirin daily.  Of note, the patient carries a history of melanoma of the right plantar portion of the foot. He reports he had a difficult time with wound healing and required skin grafts to the area. After a period of immobility he began to walk again and the area dehisced requiring treatment at a wound center. Since his treatment for melanoma he has gained approximately 60 pounds. After the treatment he was able to get back to performing ADLs and was able to walk approximately 20 yards before shortness of breath.  The patient underwent a left thoracentesis on 12/13 which demonstrated pleural fluid consistent with an exudative process.  Pleural fluid LDH 1476, serum LDH 245.  The fluid was noted to be bloody.  PCCM consulted for evaluation.      PAST MEDICAL HISTORY :   has a past medical history of Hypercholesteremia; Bilateral hearing loss; Seasonal allergies; Asthma; Cataract; Melanoma; BPH (benign prostatic hyperplasia); Essential hypertension, benign; SOB (shortness of breath); Osteoarthrosis, unspecified whether generalized or localized, other specified sites; Anemia, unspecified; Systolic murmur; Acquired stenosis  of aortic valve; Cardiac pacemaker in situ; AV block; Sinoatrial node dysfunction; Gout; Macular degeneration; Pre-syncope (2008); Dyspnea (09/17/12); Swelling of both lower extremities (09/17/12); Mild concentric left ventricular hypertrophy (LVH); and Mitral annular calcification.  has past  surgical history that includes Pacemaker insertion (05/15/06); Transurethral resection of prostate (2001); Total knee arthroplasty (2006); and melanoma excision right foot.    Prior to Admission medications   Medication Sig Start Date End Date Taking? Authorizing Provider  allopurinol (ZYLOPRIM) 100 MG tablet Take 200 mg by mouth daily.     Yes Historical Provider, MD  amLODipine (NORVASC) 5 MG tablet Take 5 mg by mouth daily.     Yes Historical Provider, MD  aspirin 81 MG tablet Take 81 mg by mouth daily.     Yes Historical Provider, MD  carvedilol (COREG) 6.25 MG tablet Take 6.25 mg by mouth 2 (two) times daily. 11/16/13  Yes Historical Provider, MD  DULERA 200-5 MCG/ACT AERO Inhale 2 puffs into the lungs 2 (two) times daily. 12/29/13  Yes Historical Provider, MD  furosemide (LASIX) 20 MG tablet Take 20 mg by mouth daily.   Yes Historical Provider, MD  HYDROcodone-acetaminophen (NORCO/VICODIN) 5-325 MG per tablet Take 1 tablet by mouth every 8 (eight) hours. 12/25/13  Yes Historical Provider, MD  methocarbamol (ROBAXIN) 500 MG tablet Take 500 mg by mouth every 8 (eight) hours as needed for muscle spasms.   Yes Historical Provider, MD  Multiple Vitamins-Minerals (OCUVITE PRESERVISION PO) Take 1 tablet by mouth 2 (two) times daily.   Yes Historical Provider, MD  nebivolol (BYSTOLIC) 10 MG tablet Take 1 tablet (10 mg total) by mouth daily. 01/09/11 02/04/13  Tanda Rockers, MD   Allergies  Allergen Reactions  . Hydrochlorothiazide Other (See Comments)    ? Causes gout  . Oxycodone Hcl Nausea And Vomiting    FAMILY HISTORY:  family history includes CAD in his father and mother; Cancer in his sister; Heart disease in his father and mother.   SOCIAL HISTORY:  reports that he quit smoking about 25 years ago. His smoking use included Cigars. He has never used smokeless tobacco. He reports that he drinks alcohol. He reports that he does not use illicit drugs.  REVIEW OF SYSTEMS:   Constitutional:  Negative for fever, chills, weight loss, malaise/fatigue and diaphoresis. Reports malaise/fatigue HENT: Negative for hearing loss, ear pain, nosebleeds, congestion, sore throat, neck pain, tinnitus and ear discharge.   Eyes: Negative for blurred vision, double vision, photophobia, pain, discharge and redness.  Respiratory: Negative for cough, hemoptysis, sputum production,  wheezing and stridor.  + SOB Cardiovascular: Negative for chest pain, palpitations, orthopnea, claudication, and PND. + LE Swelling R>L Gastrointestinal: Negative for heartburn, nausea, vomiting, abdominal pain, diarrhea, constipation, blood in stool and melena.  Genitourinary: Negative for dysuria, urgency, frequency, hematuria and flank pain.  Musculoskeletal: Negative for myalgias, joint pain and falls. + back pain Skin: Negative for itching and rash.  Neurological: Negative for dizziness, tingling, tremors, sensory change, speech change, focal weakness, seizures, loss of consciousness, weakness and headaches.  Endo/Heme/Allergies: Negative for environmental allergies and polydipsia. Does not bruise/bleed easily.  SUBJECTIVE:   VITAL SIGNS: Temp:  [97.8 F (36.6 C)-98.1 F (36.7 C)] 98 F (36.7 C) (12/14 0412) Pulse Rate:  [83-89] 83 (12/14 0412) Resp:  [17-19] 17 (12/14 0412) BP: (113-140)/(59-72) 113/63 mmHg (12/14 1020) SpO2:  [97 %-99 %] 99 % (12/14 0927)  PHYSICAL EXAMINATION: General:  Well-developed well-nourished elderly male who appears younger than stated age Neuro:  AAO 4, MAE HEENT:  Mucous membranes pink and moist, good dentition Cardiovascular:  S1-S2 rrr, SEM Lungs:  Respirations even/nonlabored, clear on right, diminished on left Abdomen:  Obese/soft Musculoskeletal:  No acute deformities Skin:  Warm/dry, lower extremity edema right greater than left   Recent Labs Lab 12/25/2013 0930 01/20/2014 1635 01/03/14 0431 01/04/14 0525  NA 134*  --  135* 132*  K 4.5  --  4.5 3.6*  CL 97  --  99  95*  CO2 19  --  22 26  BUN 20  --  21 18  CREATININE 0.90 0.94 0.90 1.10  GLUCOSE 112*  --  108* 116*    Recent Labs Lab 01/04/2014 0930 01/05/2014 1635 01/03/14 0431  HGB 9.8* 9.3* 8.7*  HCT 29.0* 28.0* 26.3*  WBC 14.3* 14.7* 12.2*  PLT 239 246 239   Dg Chest 1 View  01/03/2014   CLINICAL DATA:  Pleural effusion.  Status post left thoracentesis.  EXAM: CHEST - 1 VIEW  COMPARISON:  01/10/2014  FINDINGS: Right transvenous pacemaker leads overlie the right atrium and right ventricle. Heart borders are obscured by significant left lower lobe opacity consisting of pleural effusion and atelectasis or infiltrate. Pleural effusion has decreased in size slightly since prior study. No pneumothorax. The right lung is essentially clear.  IMPRESSION: Smaller left pleural effusion.  No pneumothorax.   Electronically Signed   By: Shon Hale M.D.   On: 01/03/2014 10:11   Ct Angio Chest Pe W/cm &/or Wo Cm  01/19/2014   CLINICAL DATA:  Patient complaining of chest pain, shortness of breath and wheezing. Shortness of Breath worsening last night. History of asthma. History of pacemaker placement and hypertension.  EXAM: CT ANGIOGRAPHY CHEST WITH CONTRAST  TECHNIQUE: Multidetector CT imaging of the chest was performed using the standard protocol during bolus administration of intravenous contrast. Multiplanar CT image reconstructions and MIPs were obtained to evaluate the vascular anatomy.  CONTRAST:  1110mL OMNIPAQUE IOHEXOL 350 MG/ML SOLN  COMPARISON:  Current chest radiograph.  FINDINGS: Motion degrades the images limiting evaluation for segmental and subsegmental pulmonary emboli. Allowing for this limitation, there is no evidence a pulmonary embolus.  Heart is normal in size and configuration. There are changes from previous cardiac surgery and mitral valve replacement. Sequential pacemaker is well positioned with its leads in the right atrium and right ventricle.  Great vessels are normal in caliber. No aortic  dissection. No mediastinal or hilar masses or pathologically enlarged lymph nodes.  There is a large left pleural effusion. It is most of the left lower lobe is collapsed from compressive atelectasis. There is milder atelectasis along the posterior left upper lobe. No evidence of a centrally obstructing endobronchial or hilar mass. No right effusion. No pulmonary edema.  Well-defined, 2.4 cm x 1.7 cm, soft tissue mass along the bronchovascular structures of the left lower lobe. No other lung masses. No convincing pneumonia.  Mild degenerative changes are noted throughout the visualized spine. No osteoblastic or osteolytic lesions.  Review of the MIP images confirms the above findings.  IMPRESSION: 1. No evidence of a pulmonary embolus. Exam limited by motion as detailed above. 2. Large left pleural effusion with near complete atelectasis of the left lower lobe. There is milder atelectasis along the posterior left upper lobe. 3. 2.4 cm well-defined soft tissue mass in the right lower lobe adjacent to the segmental bronchovascular structures. This is nonspecific. Neoplastic disease must be considered in the differential diagnosis. Consider biopsy or follow-up PET-CT. 4.  No convincing pneumonia or pulmonary edema.   Electronically Signed   By: Lajean Manes M.D.   On: 12/24/2013 12:59   US Thoracentesis Asp Pleural Space W/img Guide  01/03/2014   INDICATION: Symptomatic left sided pleural effusion  EXAM: US THORACENTESIS ASP PLEURAL SPACE W/IMG GUIDE  COMPARISON:  None  MEDICATIONS: 10 cc 1% lidocaine  COMPLICATIONS: None immediate  TECHNIQUE: Informed written consent was obtained from the patient after a discussion of the risks, benefits and alternatives to treatment. A timeout was performed prior to the initiation of the procedure.  Initial ultrasound scanning demonstrates a left pleural effusion. The lower chest was prepped and draped in the usual sterile fashion. 1% lidocaine was used for local anesthesia.   Under direct ultrasound guidance, a 19 gauge, 7-cm, Yueh catheter was introduced. An ultrasound image was saved for documentation purposes. The thoracentesis was performed. The catheter was removed and a dressing was applied. The patient tolerated the procedure well without immediate post procedural complication. The patient was escorted to have an upright chest radiograph.  FINDINGS: A total of approximately 1.3 liters of bloody fluid was removed. Requested samples were sent to the laboratory.  IMPRESSION: Successful ultrasound-guided left sided thoracentesis yielding 1.3 liters of pleural fluid.  Read by:  Lavonia Drafts [facility]   Electronically Signed   By: Arne Cleveland M.D.   On: 01/03/2014 09:52    ASSESSMENT / PLAN:  Pleural Effusion - exudate / bloody effusion based on thora 12/12 with 1.3L removed.  Hx of melanoma of R foot raises concern for malignant process process.  He denies known mechanical injury.   RLL soft tissue mass - as seen on CT, 2.4 cm adjacent to segmental bronchovascular structures.   Plan: Await cytology from pleural fluid If thoracentesis fluid is non-diagnostic, he may need a VATS bx for diagnosis / tissue sampling.  Repeat CXR afternoon of 12/14    RLE > LLE swelling - venous duplex negative for DVT  Plan: TED hose Per primary service  Back pain  Plan: Pending cytology findings consider further workup for metastasis   Noe Gens, NP-C [facility] Pgr: [phone] or [phone]  PCCM ATTENDING: Pt seen on work rounds with care provider noted above. We reviewed pt's initial presentation, consultants notes and hospital database in detail.  The above assessment and plan was formulated under my direction.  In summary: H/O melanoma resected 2011. Progressive dyspnea with mod-large L effusion. IR tapped 12/13 - described as bloody. Cytology pending. Major concern would be that this is a malignant effusion - either a recurrence of melanoma or  a new primary. Based on cytology findings, decide on pleurex vs VATS. Pt wanted to wait on cytology results before calling [facility]   Merton Border, MD;  PCCM service; Mobile [phone]   01/04/2014, 11:53 AM

## 2014-01-05 DIAGNOSIS — J90 Pleural effusion, not elsewhere classified: Secondary | ICD-10-CM

## 2014-01-05 LAB — BASIC METABOLIC PANEL
Anion gap: 13 (ref 5–15)
BUN: 16 mg/dL (ref 6–23)
CALCIUM: 8.3 mg/dL — AB (ref 8.4–10.5)
CO2: 25 mEq/L (ref 19–32)
Chloride: 99 mEq/L (ref 96–112)
Creatinine, Ser: 1.01 mg/dL (ref 0.50–1.35)
GFR, EST AFRICAN AMERICAN: 78 mL/min — AB (ref >90)
GFR, EST NON AFRICAN AMERICAN: 67 mL/min — AB (ref >90)
GLUCOSE: 109 mg/dL — AB (ref 70–99)
POTASSIUM: 3.4 meq/L — AB (ref 3.7–5.3)
Sodium: 137 mEq/L (ref 137–147)

## 2014-01-05 MED ORDER — POTASSIUM CHLORIDE CRYS ER 20 MEQ PO TBCR
40.0000 meq | EXTENDED_RELEASE_TABLET | Freq: Two times a day (BID) | ORAL | Status: AC
  Administered 2014-01-05 (×2): 40 meq via ORAL
  Filled 2014-01-05 (×2): qty 2

## 2014-01-05 NOTE — Progress Notes (Signed)
Page Dr. Venetia Constable made him aware of pt potassium level. Awaiting orders and or call back.

## 2014-01-05 NOTE — Progress Notes (Addendum)
Await pleural fluid cytology.  Results pending.  PCCM will check back this afternoon for follow up.  Otherwise, we will see again once pleural fluid returned.     Aaron Gens, NP-C [facility] Pgr: [phone] or [phone]  I have spoken with pathology - cytology from pleural fluid is negative. I believe the most expeditious way to proceed is with surgical intervention (VATS or thoracotomy) with biopsy of any suspicious lesions and talc pleurodesis. If that fails to yield a diagnosis. Further eval of the RLL nodule/mass can be undertaken with possible PET scan and/or ENB.  I have asked TCTS for input and will discuss with patient  Merton Border, MD ; [facility] service Mobile [phone].  After 5:30 PM or weekends, call [phone]

## 2014-01-05 NOTE — Evaluation (Signed)
Physical Therapy Evaluation Patient Details Name: Aaron [name] MRN: [id number] DOB: [birth year] Today's Date: 01/05/2014   History of Present Illness  Aaron [name] is a 80 y.o. male past medical history of cigar use, chronic diastolic heart failure and moderate aortic stenosis, also with melanoma in the right foot status post excision 3 years ago that comes in for sudden onset of shortness of breath that started the day prior to admission. Pt with pleural effusion s/p L thoracentesis     Clinical Impression  Patient is pleasant and agreeable to therapy but not confident in his abilities due to recent severe dyspnea.  He was able to ambulate without supplemental O2 with sats remaining above 93% throughout session, although short of breath for the duration.  Educated on pursed lip breathing to manage shortness of breath and exercises to maintain LE strength and improve endurance during hospital stay.  LE strength 4/5 for hip flexion, 5/5 for knee extension, knee flexion, and dorsiflexion.  Recommend use of RW for mobility at this time, aiming for independence at d/c.  Pt would benefit from skilled therapy to address deficits in endurance and functional mobility in order to maximize independence and return to PLOF.      Follow Up Recommendations Home health PT    Equipment Recommendations    None recommended by PT   Recommendations for Other Services OT consult     Precautions / Restrictions Precautions Precautions: Fall      Mobility  Bed Mobility                  Transfers Overall transfer level: Modified independent                  Ambulation/Gait Ambulation/Gait assistance: Supervision Ambulation Distance (Feet): 150 Feet Assistive device: Rolling walker (2 wheeled) Gait Pattern/deviations: Step-through pattern;Decreased stride length;Trunk flexed   Gait velocity interpretation: Below normal speed for age/gender General Gait Details: pt performing pursed  lip breathing but bracing on RW with gait for breathing  Stairs            Wheelchair Mobility    Modified Rankin (Stroke Patients Only)       Balance Overall balance assessment: Needs assistance   Sitting balance-Leahy Scale: Good       Standing balance-Leahy Scale: Fair                               Pertinent Vitals/Pain Pain Assessment: No/denies pain  SpO2 resting: 97% (RA) SpO2 during ambulation: 93-97% (RA) Max HR during session: 114    Home Living Family/patient expects to be discharged to:: Private residence Living Arrangements: Alone Available Help at Discharge: Family;Available 24 hours/day Type of Home: House Home Access: Stairs to enter   [employer] of Steps: 3 Home Layout: One level Home Equipment: Walker - 2 wheels;Crutches;Bedside commode      Prior Function Level of Independence: Independent         Comments: no cooking, eats out and still drives. Unable to ambulate for exercise for several years due to melanoma on foot     Hand Dominance        Extremity/Trunk Assessment   Upper Extremity Assessment: Overall WFL for tasks assessed           Lower Extremity Assessment: Overall WFL for tasks assessed      Cervical / Trunk Assessment: Normal  Communication   Communication: HOH  Cognition Arousal/Alertness:  Awake/alert Behavior During Therapy: WFL for tasks assessed/performed Overall Cognitive Status: Within Functional Limits for tasks assessed                      General Comments      Exercises General Exercises - Lower Extremity Long Arc Quad: AROM;Seated;Both;10 reps Hip Flexion/Marching: AROM;Seated;Both;10 reps      Assessment/Plan    PT Assessment Patient needs continued PT services  PT Diagnosis Difficulty walking   PT Problem List Decreased activity tolerance;Decreased mobility;Cardiopulmonary status limiting activity  PT Treatment Interventions DME instruction;Gait  training;Stair training;Functional mobility training;Therapeutic activities;Therapeutic exercise;Patient/family education   PT Goals (Current goals can be found in the Care Plan section) Acute Rehab PT Goals Patient Stated Goal: to go home PT Goal Formulation: With patient/family Time For Goal Achievement: 01/19/14 Potential to Achieve Goals: Good    Frequency Min 3X/week   Barriers to discharge        Co-evaluation               End of Session   Activity Tolerance: Patient tolerated treatment well;Patient limited by fatigue Patient left: in chair;with family/visitor present;with call bell/phone within reach (On room air)           Time: 6962-9528 PT Time Calculation (min) (ACUTE ONLY): 22 min   Charges:   PT Evaluation $Initial PT Evaluation Tier I: 1 Procedure PT Treatments $Gait Training: 8-22 mins   PT G Codes:          Mekaila Tarnow SPT 01/05/2014, 9:44 AM

## 2014-01-05 NOTE — Progress Notes (Signed)
[facility] PROGRESS NOTE  Assessment/Plan: Acute respiratory failure with hypoxia due to  Pleural effusion, left: - Ct angio of chest as below. - S/p US guided thoracocentesis, awaiting cultures, LDH and protein. - cytology pending. Awaiting cytology. - consult pulmonary  Leukocytosis: - improving.  Swelling of lower extremity - doppler negative for DVT.  Chronic diastolic heart failure - Hold lasix. Check a b-me tin am. - place ted hose.  H/O Malignant melanoma of right foot     Code Status: full Family Communication: none  Disposition Plan: inpatient   Consultants:  IR  Procedures:  CXR 12.13.2015: Smaller left pleural effusion. No pneumothorax. Ct angio 12.12.2015; no PE, pleural effusion, 2.4 cm well-defined soft tissue mass in the right lower lobe  adjacent to the segmental bronchovascular structures  Antibiotics:  None  HPI/Subjective: Relates SOB is better  Objective: Filed Vitals:   01/05/14 0428 01/05/14 0908 01/05/14 1014 01/05/14 1057  BP: 119/56   124/58  Pulse: 83 112  100  Temp: 97.9 F (36.6 C)   98.7 F (37.1 C)  TempSrc: Oral   Oral  Resp: 18     Height: 6\' 1"  (1.854 m)     Weight: 129.7 kg (285 lb 15 oz)     SpO2: 100% 93% 97% 97%    Intake/Output Summary (Last 24 hours) at 01/05/14 1059 Last data filed at 01/05/14 0823  Gross per 24 hour  Intake    840 ml  Output    450 ml  Net    390 ml   Filed Weights   01/03/14 0500 01/05/14 0428  Weight: 130.863 kg (288 lb 8 oz) 129.7 kg (285 lb 15 oz)    Exam:  General: Alert, awake, oriented x3, in no acute distress.  HEENT: No bruits, no goiter.  Heart: Regular rate and rhythm. Lungs: Good air movement. Abdomen: Soft, nontender, nondistended, positive bowel sounds.    Data Reviewed: Basic Metabolic Panel:  Recent Labs Lab 12/29/2013 0930 12/26/2013 1635 01/03/14 0431 01/04/14 0525 01/05/14 0313  NA 134*  --  135* 132* 137  K 4.5  --  4.5 3.6* 3.4*  CL 97   --  99 95* 99  CO2 19  --  22 26 25   GLUCOSE 112*  --  108* 116* 109*  BUN 20  --  21 18 16   CREATININE 0.90 0.94 0.90 1.10 1.01  CALCIUM 8.4  --  8.4 8.3* 8.3*   Liver Function Tests:  Recent Labs Lab 12/27/2013 0930 01/03/14 1200  AST 18 21  ALT 16 17  ALKPHOS 69 68  BILITOT 0.5 0.3  PROT 6.8 6.5  ALBUMIN 3.2* 3.2*   No results for input(s): LIPASE, AMYLASE in the last 168 hours. No results for input(s): AMMONIA in the last 168 hours. CBC:  Recent Labs Lab 12/28/2013 0930 01/10/2014 1635 01/03/14 0431 01/04/14 1140  WBC 14.3* 14.7* 12.2* 11.9*  NEUTROABS 10.5*  --   --   --   HGB 9.8* 9.3* 8.7* 8.0*  HCT 29.0* 28.0* 26.3* 23.7*  MCV 94.8 95.2 96.0 97.9  PLT 239 246 239 234   Cardiac Enzymes:  Recent Labs Lab 12/23/2013 1635 01/19/2014 2130 01/03/14 0431  TROPONINI <0.30 <0.30 <0.30   BNP (last 3 results)  Recent Labs  12/22/2013 0930  PROBNP 340.4   CBG:  Recent Labs Lab 01/03/14 1611 01/03/14 2037 01/04/14 0414 01/04/14 0817 01/04/14 1120  GLUCAP 126* 127* 117* 122* 125*    Recent Results (from the past  240 hour(s))  Culture, blood (routine x 2)     Status: None (Preliminary result)   Collection Time: 01/01/2014  4:20 PM  Result Value Ref Range Status   Specimen Description BLOOD LEFT ARM  Final   Special Requests BOTTLES DRAWN AEROBIC AND ANAEROBIC 10 CC  Final   Culture  Setup Time   Final    12/31/2013 22:46 Performed at [facility]    Culture   Final           BLOOD CULTURE RECEIVED NO GROWTH TO DATE CULTURE WILL BE HELD FOR 5 DAYS BEFORE ISSUING A FINAL NEGATIVE REPORT Performed at [facility]    Report Status PENDING  Incomplete  Culture, blood (routine x 2)     Status: None (Preliminary result)   Collection Time: 12/26/2013  4:30 PM  Result Value Ref Range Status   Specimen Description BLOOD RIGHT HAND  Final   Special Requests BOTTLES DRAWN AEROBIC ONLY 5 ML  Final   Culture  Setup Time   Final    01/19/2014  22:46 Performed at [facility]    Culture   Final           BLOOD CULTURE RECEIVED NO GROWTH TO DATE CULTURE WILL BE HELD FOR 5 DAYS BEFORE ISSUING A FINAL NEGATIVE REPORT Performed at [facility]    Report Status PENDING  Incomplete  Body fluid culture     Status: None (Preliminary result)   Collection Time: 01/03/14  9:33 AM  Result Value Ref Range Status   Specimen Description PLEURAL LEFT FLUID  Final   Special Requests NONE  Final   Gram Stain   Final    MODERATE WBC PRESENT,BOTH PMN AND MONONUCLEAR NO SQUAMOUS EPITHELIAL CELLS SEEN NO ORGANISMS SEEN Performed at [facility]    Culture   Final    NO GROWTH 2 DAYS Performed at [facility]    Report Status PENDING  Incomplete     Studies: Dg Chest 2 View  01/04/2014   CLINICAL DATA:  Three-day history of difficulty breathing  EXAM: CHEST  2 VIEW  COMPARISON:  January 03, 2014  FINDINGS: There is persistent left effusion with left base consolidation. Right lung is clear. Heart is mildly enlarged with pulmonary vascularity within normal limits. Pacemaker leads are attached to the right atrium and right ventricle. No adenopathy. No pneumothorax. There is degenerative change in the lower thoracic spine.  IMPRESSION: Persistent left effusion with left base consolidation. Right lung clear. Stable cardiac prominence.   Electronically Signed   By: Lowella Grip M.D.   On: 01/04/2014 16:56    Scheduled Meds: . allopurinol  200 mg Oral Daily  . amLODipine  5 mg Oral Daily  . antiseptic oral rinse  7 mL Mouth Rinse BID  . aspirin EC  81 mg Oral Daily  . carvedilol  6.25 mg Oral BID  . heparin  5,000 Units Subcutaneous 3 times per day  . HYDROcodone-acetaminophen  1 tablet Oral 3 times per day  . mometasone-formoterol  2 puff Inhalation BID   Continuous Infusions:    Charlynne Cousins  [facility] Pager [phone]. If 8PM-8AM, please contact night-coverage at [email or web address],  password [facility] 01/05/2014, 10:59 AM  LOS: 3 days

## 2014-01-06 DIAGNOSIS — J910 Malignant pleural effusion: Secondary | ICD-10-CM

## 2014-01-06 MED ORDER — POTASSIUM CHLORIDE CRYS ER 20 MEQ PO TBCR
40.0000 meq | EXTENDED_RELEASE_TABLET | Freq: Two times a day (BID) | ORAL | Status: AC
  Administered 2014-01-06 (×2): 40 meq via ORAL
  Filled 2014-01-06 (×2): qty 2

## 2014-01-06 MED ORDER — SORBITOL 70 % SOLN
30.0000 mL | Freq: Two times a day (BID) | Status: AC
  Administered 2014-01-06: 30 mL via ORAL
  Filled 2014-01-06 (×2): qty 30

## 2014-01-06 NOTE — Progress Notes (Signed)
[facility] PROGRESS NOTE  Assessment/Plan: Acute respiratory failure with hypoxia due to  Pleural effusion, left: - Ct angio of chest as below. - S/p US guided thoracocentesis, awaiting cultures, LDH and protein. - cytology pending. Awaiting cytology. - Leading cardiothoracic surgery  Recommendations. Short of breath waxing and waning  Leukocytosis: - improving.  Swelling of lower extremity - doppler negative for DVT.  Chronic diastolic heart failure - Hold lasix. Check a b-me tin am. - place ted hose.  H/O Malignant melanoma of right foot     Code Status: full Family Communication: none  Disposition Plan: inpatient   Consultants:  IR  Procedures:  CXR 12.13.2015: Smaller left pleural effusion. No pneumothorax. Ct angio 12.12.2015; no PE, pleural effusion, 2.4 cm well-defined soft tissue mass in the right lower lobe  adjacent to the segmental bronchovascular structures  Antibiotics:  None  HPI/Subjective: Shortness of breath is waxing and waning   Objective: Filed Vitals:   01/05/14 2136 01/06/14 0416 01/06/14 0926 01/06/14 0951  BP:  119/51  117/52  Pulse:  75    Temp:  97.9 F (36.6 C)    TempSrc:  Oral    Resp:  20    Height:  6\' 1"  (1.854 m)    Weight:  130.8 kg (288 lb 5.8 oz)    SpO2: 98% 97% 95%     Intake/Output Summary (Last 24 hours) at 01/06/14 1158 Last data filed at 01/06/14 1100  Gross per 24 hour  Intake    840 ml  Output   1000 ml  Net   -160 ml   Filed Weights   01/03/14 0500 01/05/14 0428 01/06/14 0416  Weight: 130.863 kg (288 lb 8 oz) 129.7 kg (285 lb 15 oz) 130.8 kg (288 lb 5.8 oz)    Exam:  General: Alert, awake, oriented x3, in no acute distress.  HEENT: No bruits, no goiter.  Heart: Regular rate and rhythm. Lungs: Good air movement. Abdomen: Soft, nontender, nondistended, positive bowel sounds.    Data Reviewed: Basic Metabolic Panel:  Recent Labs Lab 01/17/2014 0930 01/03/2014 1635 01/03/14 0431  01/04/14 0525 01/05/14 0313  NA 134*  --  135* 132* 137  K 4.5  --  4.5 3.6* 3.4*  CL 97  --  99 95* 99  CO2 19  --  22 26 25   GLUCOSE 112*  --  108* 116* 109*  BUN 20  --  21 18 16   CREATININE 0.90 0.94 0.90 1.10 1.01  CALCIUM 8.4  --  8.4 8.3* 8.3*   Liver Function Tests:  Recent Labs Lab 12/30/2013 0930 01/03/14 1200  AST 18 21  ALT 16 17  ALKPHOS 69 68  BILITOT 0.5 0.3  PROT 6.8 6.5  ALBUMIN 3.2* 3.2*   No results for input(s): LIPASE, AMYLASE in the last 168 hours. No results for input(s): AMMONIA in the last 168 hours. CBC:  Recent Labs Lab 01/10/2014 0930 01/12/2014 1635 01/03/14 0431 01/04/14 1140  WBC 14.3* 14.7* 12.2* 11.9*  NEUTROABS 10.5*  --   --   --   HGB 9.8* 9.3* 8.7* 8.0*  HCT 29.0* 28.0* 26.3* 23.7*  MCV 94.8 95.2 96.0 97.9  PLT 239 246 239 234   Cardiac Enzymes:  Recent Labs Lab 12/31/2013 1635 01/10/2014 2130 01/03/14 0431  TROPONINI <0.30 <0.30 <0.30   BNP (last 3 results)  Recent Labs  01/14/2014 0930  PROBNP 340.4   CBG:  Recent Labs Lab 01/03/14 1611 01/03/14 2037 01/04/14 0414 01/04/14 0817 01/04/14  [place]* 122* 125*    Recent Results (from the past 240 hour(s))  Culture, blood (routine x 2)     Status: None (Preliminary result)   Collection Time: 01/15/2014  4:20 PM  Result Value Ref Range Status   Specimen Description BLOOD LEFT ARM  Final   Special Requests BOTTLES DRAWN AEROBIC AND ANAEROBIC 10 CC  Final   Culture  Setup Time   Final    12/23/2013 22:46 Performed at [facility]    Culture   Final           BLOOD CULTURE RECEIVED NO GROWTH TO DATE CULTURE WILL BE HELD FOR 5 DAYS BEFORE ISSUING A FINAL NEGATIVE REPORT Performed at [facility]    Report Status PENDING  Incomplete  Culture, blood (routine x 2)     Status: None (Preliminary result)   Collection Time: 12/22/2013  4:30 PM  Result Value Ref Range Status   Specimen Description BLOOD RIGHT HAND  Final   Special Requests  BOTTLES DRAWN AEROBIC ONLY 5 ML  Final   Culture  Setup Time   Final    01/14/2014 22:46 Performed at [facility]    Culture   Final           BLOOD CULTURE RECEIVED NO GROWTH TO DATE CULTURE WILL BE HELD FOR 5 DAYS BEFORE ISSUING A FINAL NEGATIVE REPORT Performed at [facility]    Report Status PENDING  Incomplete  Body fluid culture     Status: None (Preliminary result)   Collection Time: 01/03/14  9:33 AM  Result Value Ref Range Status   Specimen Description PLEURAL LEFT FLUID  Final   Special Requests NONE  Final   Gram Stain   Final    MODERATE WBC PRESENT,BOTH PMN AND MONONUCLEAR NO SQUAMOUS EPITHELIAL CELLS SEEN NO ORGANISMS SEEN Performed at [facility]    Culture   Final    NO GROWTH 2 DAYS Performed at [facility]    Report Status PENDING  Incomplete     Studies: Dg Chest 2 View  01/04/2014   CLINICAL DATA:  Three-day history of difficulty breathing  EXAM: CHEST  2 VIEW  COMPARISON:  January 03, 2014  FINDINGS: There is persistent left effusion with left base consolidation. Right lung is clear. Heart is mildly enlarged with pulmonary vascularity within normal limits. Pacemaker leads are attached to the right atrium and right ventricle. No adenopathy. No pneumothorax. There is degenerative change in the lower thoracic spine.  IMPRESSION: Persistent left effusion with left base consolidation. Right lung clear. Stable cardiac prominence.   Electronically Signed   By: Lowella Grip M.D.   On: 01/04/2014 16:56    Scheduled Meds: . allopurinol  200 mg Oral Daily  . amLODipine  5 mg Oral Daily  . antiseptic oral rinse  7 mL Mouth Rinse BID  . aspirin EC  81 mg Oral Daily  . carvedilol  6.25 mg Oral BID  . heparin  5,000 Units Subcutaneous 3 times per day  . HYDROcodone-acetaminophen  1 tablet Oral 3 times per day  . mometasone-formoterol  2 puff Inhalation BID  . potassium chloride  40 mEq Oral BID  . sorbitol  30 mL Oral BID    Continuous Infusions:    Charlynne Cousins  [facility] Pager [phone]. If 8PM-8AM, please contact night-coverage at [email or web address], password [facility] 01/06/2014, 11:58 AM  LOS: 4 days

## 2014-01-06 NOTE — Progress Notes (Signed)
I spoke with patient yesterday re: my recommendations that we proceed with VATS and I requested TCTS consultation. It does not appear that he has been seen by TCTS. I will ensure that the consult request has been received. Otherwise, PCCM will remain in the background. If VATS does not yield a definitive diagnosis, then we will direct evaluation of the RLL nodule. Such evaluation can be undertaken as outpt  Merton Border, MD ; [facility] [phone].  After 5:30 PM or weekends, call [phone]

## 2014-01-06 NOTE — Consult Note (Signed)
Reason for Consult: Left pleural effusion Referring Physician: Dr. Celedonio Savage  Aaron [name] is an 80 y.o. male.  HPI: 80 yo man with a history of melanoma in 2011 and known moderate AS and numerous other medical problems presents with a cc/o shortness of breath.  Aaron [name] has a history of smoking cigars when younger but never smoked cigarettes. He was in his usual state of health until last Friday when he noted shortness of breath with exertion. He could previously walk about a block, but by Friday walking around his house was difficult. He has had some chest tightness. He gets short of breath trying to lie flat. He laso has had leg swelling right > left although has gotten better with lasix.  He had a chest x-ray in the ED which showed a large left effusion. A CT angio showed no PE but there was a large left effusion and also right lower lobe nodule.  He had thoracentesis on Sunday. 1.3 L was drained. The patient had symptomatic relief but his symptoms worsened again on Monday. The fluid was bloody. Cytology was negative.  Past Medical History  Diagnosis Date  . Hypercholesteremia   . Bilateral hearing loss   . Seasonal allergies   . Asthma   . Cataract   . Melanoma     right foot  . BPH (benign prostatic hyperplasia)   . Essential hypertension, benign   . SOB (shortness of breath)   . Osteoarthrosis, unspecified whether generalized or localized, other specified sites   . Anemia, unspecified   . Systolic murmur     Due to mild aortic stenosis  . Acquired stenosis of aortic valve     ECHO 10/07/12 - Moderate aortic valve stenosis. EF estimated at 50-55%.  . Cardiac pacemaker in situ     DDD pacer 04/2006 by Dr. Tamala Julian  . AV block     DDD pacer 04/2006 by Dr. Tamala Julian  . Sinoatrial node dysfunction   . Gout   . Macular degeneration     Dr. Jon Billings  . Pre-syncope 2008  . Dyspnea 09/17/12  . Swelling of both lower extremities 09/17/12  . Mild concentric left ventricular hypertrophy  (LVH)     By ECHO 10/07/12  . Mitral annular calcification     Moderate. By ECHO 10/07/12    Past Surgical History  Procedure Laterality Date  . Pacemaker insertion  05/15/06    Dual chamber permanent transvenous pacemaker  . Transurethral resection of prostate  2001  . Total knee arthroplasty  2006  . Melanoma excision right foot      Family History  Problem Relation Age of Onset  . Heart disease Mother   . CAD Mother   . Heart disease Father   . CAD Father   . Cancer Sister     breast    Social History:  reports that he quit smoking about 25 years ago. His smoking use included Cigars. He has never used smokeless tobacco. He reports that he drinks alcohol. He reports that he does not use illicit drugs.  Allergies:  Allergies  Allergen Reactions  . Hydrochlorothiazide Other (See Comments)    ? Causes gout  . Oxycodone Hcl Nausea And Vomiting    Medications:  Scheduled: . allopurinol  200 mg Oral Daily  . amLODipine  5 mg Oral Daily  . antiseptic oral rinse  7 mL Mouth Rinse BID  . aspirin EC  81 mg Oral Daily  . carvedilol  6.25 mg  Oral BID  . heparin  5,000 Units Subcutaneous 3 times per day  . HYDROcodone-acetaminophen  1 tablet Oral 3 times per day  . mometasone-formoterol  2 puff Inhalation BID  . potassium chloride  40 mEq Oral BID  . sorbitol  30 mL Oral BID    Results for orders placed or performed during the hospital encounter of 01/15/2014 (from the past 48 hour(s))  Basic metabolic panel     Status: Abnormal   Collection Time: 01/05/14  3:13 AM  Result Value Ref Range   Sodium 137 137 - 147 mEq/L   Potassium 3.4 (L) 3.7 - 5.3 mEq/L   Chloride 99 96 - 112 mEq/L   CO2 25 19 - 32 mEq/L   Glucose, Bld 109 (H) 70 - 99 mg/dL   BUN 16 6 - 23 mg/dL   Creatinine, Ser 1.01 0.50 - 1.35 mg/dL   Calcium 8.3 (L) 8.4 - 10.5 mg/dL   GFR calc non Af Amer 67 (L) >90 mL/min   GFR calc Af Amer 78 (L) >90 mL/min    Comment: (NOTE) The eGFR has been calculated using the  CKD EPI equation. This calculation has not been validated in all clinical situations. eGFR's persistently <90 mL/min signify possible Chronic Kidney Disease.    Anion gap 13 5 - 15    Dg Chest 2 View  01/04/2014   CLINICAL DATA:  Three-day history of difficulty breathing  EXAM: CHEST  2 VIEW  COMPARISON:  January 03, 2014  FINDINGS: There is persistent left effusion with left base consolidation. Right lung is clear. Heart is mildly enlarged with pulmonary vascularity within normal limits. Pacemaker leads are attached to the right atrium and right ventricle. No adenopathy. No pneumothorax. There is degenerative change in the lower thoracic spine.  IMPRESSION: Persistent left effusion with left base consolidation. Right lung clear. Stable cardiac prominence.   Electronically Signed   By: Lowella Grip M.D.   On: 01/04/2014 16:56   CT ANGIOGRAPHY CHEST WITH CONTRAST  TECHNIQUE: Multidetector CT imaging of the chest was performed using the standard protocol during bolus administration of intravenous contrast. Multiplanar CT image reconstructions and MIPs were obtained to evaluate the vascular anatomy.  CONTRAST: 141m OMNIPAQUE IOHEXOL 350 MG/ML SOLN  COMPARISON: Current chest radiograph.  FINDINGS: Motion degrades the images limiting evaluation for segmental and subsegmental pulmonary emboli. Allowing for this limitation, there is no evidence a pulmonary embolus.  Heart is normal in size and configuration. There are changes from previous cardiac surgery and mitral valve replacement. Sequential pacemaker is well positioned with its leads in the right atrium and right ventricle.  Great vessels are normal in caliber. No aortic dissection. No mediastinal or hilar masses or pathologically enlarged lymph nodes.  There is a large left pleural effusion. It is most of the left lower lobe is collapsed from compressive atelectasis. There is milder atelectasis along the  posterior left upper lobe. No evidence of a centrally obstructing endobronchial or hilar mass. No right effusion. No pulmonary edema.  Well-defined, 2.4 cm x 1.7 cm, soft tissue mass along the bronchovascular structures of the left lower lobe. No other lung masses. No convincing pneumonia.  Mild degenerative changes are noted throughout the visualized spine. No osteoblastic or osteolytic lesions.  Review of the MIP images confirms the above findings.  IMPRESSION: 1. No evidence of a pulmonary embolus. Exam limited by motion as detailed above. 2. Large left pleural effusion with near complete atelectasis of the left lower lobe. There is  milder atelectasis along the posterior left upper lobe. 3. 2.4 cm well-defined soft tissue mass in the right lower lobe adjacent to the segmental bronchovascular structures. This is nonspecific. Neoplastic disease must be considered in the differential diagnosis. Consider biopsy or follow-up PET-CT. 4. No convincing pneumonia or pulmonary edema.   Electronically Signed  By: Lajean Manes M.D.  On: 12/25/2013 12:59   Review of Systems  Constitutional: Negative for fever and chills.  Respiratory: Positive for cough and shortness of breath.   Cardiovascular: Positive for leg swelling. Negative for chest pain.  All other systems reviewed and are negative.  Blood pressure 130/61, pulse 72, temperature 98.7 F (37.1 C), temperature source Oral, resp. rate 26, height 6' 1"  (1.854 m), weight 288 lb 5.8 oz (130.8 kg), SpO2 97 %. Physical Exam  Vitals reviewed. Constitutional: He is oriented to person, place, and time. He appears distressed (mild, respiratory).  obese  HENT:  Head: Normocephalic and atraumatic.  Eyes: EOM are normal. Pupils are equal, round, and reactive to light.  Neck: Neck supple. No thyromegaly present.  Cardiovascular: Normal rate and regular rhythm.   Murmur (7-1/6 systolic RUSB) heard. Respiratory: He has no  wheezes. He has no rales.  Increased WOB, absent BS left base  GI: Soft. There is no tenderness.  Musculoskeletal: He exhibits edema (2+ R, 1+ L).  Lymphadenopathy:    He has no cervical adenopathy.  Neurological: He is alert and oriented to person, place, and time. No cranial nerve deficit.  Skin: Skin is warm and dry.    Assessment/Plan: 80 yo man who presents with shortness of breath and has a large left effusion. The fluid was bloody which is concerning given his history of melanoma. There also is a possibility the effusion is infectious or inflammatory in nature, but the suspicion for malignancy is high.  He did get symptomatic relief form thoracentesis.  I recommended that we proceed with a left VATS to drain the effusion, possible decortication, and possible pleural catheter placement.   I discussed the general nature of the procedure, the need for general anesthesia, and the incisions to be used with Mr Tozzi and his family. We discussed the expected hospital stay, overall recovery and short and long term outcomes. They understand the risks include, but are not limited to death, stroke, MI, DVT/PE, bleeding, possible need for transfusion, infections, prolonged air leaks, and other organ system dysfunction including respiratory, renal, or GI complications. He understands we will leave in a pleural drainage catheter which will require care after discharge.  He accepts the risks and agrees to proceed  For OR tomorrow  Jaquasia Doscher C 01/06/2014, 4:10 PM

## 2014-01-07 ENCOUNTER — Inpatient Hospital Stay (HOSPITAL_COMMUNITY): Payer: Medicare Other

## 2014-01-07 ENCOUNTER — Inpatient Hospital Stay (HOSPITAL_COMMUNITY): Payer: Medicare Other | Admitting: Certified Registered Nurse Anesthetist

## 2014-01-07 ENCOUNTER — Encounter (HOSPITAL_COMMUNITY): Admission: EM | Disposition: E | Payer: Self-pay | Source: Home / Self Care | Attending: Internal Medicine

## 2014-01-07 ENCOUNTER — Encounter (HOSPITAL_COMMUNITY): Payer: Self-pay | Admitting: Anesthesiology

## 2014-01-07 HISTORY — PX: CHEST TUBE INSERTION: SHX231

## 2014-01-07 HISTORY — PX: VIDEO ASSISTED THORACOSCOPY (VATS)/DECORTICATION: SHX6171

## 2014-01-07 HISTORY — PX: PLEURAL EFFUSION DRAINAGE: SHX5099

## 2014-01-07 LAB — BODY FLUID CULTURE: Culture: NO GROWTH

## 2014-01-07 LAB — GLUCOSE, CAPILLARY: Glucose-Capillary: 119 mg/dL — ABNORMAL HIGH (ref 70–99)

## 2014-01-07 SURGERY — VIDEO ASSISTED THORACOSCOPY (VATS)/DECORTICATION
Anesthesia: General | Site: Chest | Laterality: Left

## 2014-01-07 MED ORDER — ONDANSETRON HCL 4 MG/2ML IJ SOLN
INTRAMUSCULAR | Status: DC | PRN
  Administered 2014-01-07: 4 mg via INTRAVENOUS

## 2014-01-07 MED ORDER — HYDROMORPHONE HCL 1 MG/ML IJ SOLN
INTRAMUSCULAR | Status: AC
  Filled 2014-01-07: qty 1

## 2014-01-07 MED ORDER — ROCURONIUM BROMIDE 100 MG/10ML IV SOLN
INTRAVENOUS | Status: DC | PRN
  Administered 2014-01-07: 10 mg via INTRAVENOUS
  Administered 2014-01-07: 30 mg via INTRAVENOUS

## 2014-01-07 MED ORDER — BISACODYL 5 MG PO TBEC
10.0000 mg | DELAYED_RELEASE_TABLET | Freq: Every day | ORAL | Status: DC
  Administered 2014-01-08 – 2014-01-09 (×2): 10 mg via ORAL
  Filled 2014-01-07 (×2): qty 2

## 2014-01-07 MED ORDER — LIDOCAINE HCL (CARDIAC) 20 MG/ML IV SOLN
INTRAVENOUS | Status: AC
  Filled 2014-01-07: qty 5

## 2014-01-07 MED ORDER — MORPHINE SULFATE (PF) 1 MG/ML IV SOLN
INTRAVENOUS | Status: DC
  Administered 2014-01-07: 14 mg via INTRAVENOUS
  Administered 2014-01-07 (×2): via INTRAVENOUS
  Administered 2014-01-07: 13 mg via INTRAVENOUS
  Administered 2014-01-08: 10 mg via INTRAVENOUS
  Administered 2014-01-08: 4 mg via INTRAVENOUS
  Administered 2014-01-08: 11 mg via INTRAVENOUS
  Administered 2014-01-08 (×2): via INTRAVENOUS
  Administered 2014-01-08: 8 mg via INTRAVENOUS
  Administered 2014-01-08: 11 mg via INTRAVENOUS
  Administered 2014-01-08: 17 mg via INTRAVENOUS
  Administered 2014-01-09: 1 mg via INTRAVENOUS
  Administered 2014-01-09: 5 mg via INTRAVENOUS
  Filled 2014-01-07 (×5): qty 25

## 2014-01-07 MED ORDER — GLYCOPYRROLATE 0.2 MG/ML IJ SOLN
INTRAMUSCULAR | Status: DC | PRN
  Administered 2014-01-07: .8 mg via INTRAVENOUS

## 2014-01-07 MED ORDER — LABETALOL HCL 5 MG/ML IV SOLN
INTRAVENOUS | Status: AC
  Filled 2014-01-07: qty 4

## 2014-01-07 MED ORDER — ALBUTEROL SULFATE HFA 108 (90 BASE) MCG/ACT IN AERS
INHALATION_SPRAY | RESPIRATORY_TRACT | Status: DC | PRN
  Administered 2014-01-07: 2 via RESPIRATORY_TRACT

## 2014-01-07 MED ORDER — FENTANYL CITRATE 0.05 MG/ML IJ SOLN
INTRAMUSCULAR | Status: DC | PRN
  Administered 2014-01-07: 50 ug via INTRAVENOUS
  Administered 2014-01-07: 100 ug via INTRAVENOUS
  Administered 2014-01-07: 50 ug via INTRAVENOUS
  Administered 2014-01-07: 100 ug via INTRAVENOUS

## 2014-01-07 MED ORDER — ACETAMINOPHEN 500 MG PO TABS: 1000.0000 mg | ORAL_TABLET | Freq: Four times a day (QID) | ORAL | Status: DC

## 2014-01-07 MED ORDER — SUCCINYLCHOLINE CHLORIDE 20 MG/ML IJ SOLN
INTRAMUSCULAR | Status: DC | PRN
  Administered 2014-01-07: 100 mg via INTRAVENOUS

## 2014-01-07 MED ORDER — MORPHINE SULFATE (PF) 1 MG/ML IV SOLN
INTRAVENOUS | Status: AC
  Administered 2014-01-07: 4 mg
  Filled 2014-01-07: qty 25

## 2014-01-07 MED ORDER — LACTATED RINGERS IV SOLN
INTRAVENOUS | Status: DC | PRN
  Administered 2014-01-07: 10:00:00 via INTRAVENOUS

## 2014-01-07 MED ORDER — DEXTROSE 5 % IV SOLN: 1.5000 g | INTRAVENOUS | Status: DC

## 2014-01-07 MED ORDER — ONDANSETRON HCL 4 MG/2ML IJ SOLN: 4.0000 mg | Freq: Four times a day (QID) | INTRAMUSCULAR | Status: DC | PRN

## 2014-01-07 MED ORDER — LABETALOL HCL 5 MG/ML IV SOLN
INTRAVENOUS | Status: DC | PRN
  Administered 2014-01-07: 5 mg via INTRAVENOUS

## 2014-01-07 MED ORDER — CEFUROXIME SODIUM 1.5 G IJ SOLR
1.5000 g | Freq: Two times a day (BID) | INTRAMUSCULAR | Status: AC
  Administered 2014-01-07 – 2014-01-08 (×2): 1.5 g via INTRAVENOUS
  Filled 2014-01-07 (×2): qty 1.5

## 2014-01-07 MED ORDER — HYDROMORPHONE HCL 1 MG/ML IJ SOLN
0.2500 mg | INTRAMUSCULAR | Status: DC | PRN
  Administered 2014-01-07 (×3): 0.5 mg via INTRAVENOUS
  Administered 2014-01-07: 5 mg via INTRAVENOUS
  Administered 2014-01-07: 0.5 mg via INTRAVENOUS

## 2014-01-07 MED ORDER — LIDOCAINE HCL (CARDIAC) 20 MG/ML IV SOLN
INTRAVENOUS | Status: DC | PRN
  Administered 2014-01-07: 80 mg via INTRAVENOUS

## 2014-01-07 MED ORDER — DEXTROSE 5 % IV SOLN: 1.5000 g | Freq: Two times a day (BID) | INTRAVENOUS | Status: DC

## 2014-01-07 MED ORDER — TRAMADOL HCL 50 MG PO TABS
50.0000 mg | ORAL_TABLET | Freq: Four times a day (QID) | ORAL | Status: DC | PRN
  Administered 2014-01-07 – 2014-01-09 (×2): 100 mg via ORAL
  Filled 2014-01-07 (×2): qty 2

## 2014-01-07 MED ORDER — FENTANYL CITRATE 0.05 MG/ML IJ SOLN
INTRAMUSCULAR | Status: AC
  Filled 2014-01-07: qty 5

## 2014-01-07 MED ORDER — PROPOFOL 10 MG/ML IV BOLUS
INTRAVENOUS | Status: DC | PRN
  Administered 2014-01-07: 200 mg via INTRAVENOUS

## 2014-01-07 MED ORDER — TALC 5 G PL SUSR
INTRAPLEURAL | Status: AC
  Filled 2014-01-07: qty 5

## 2014-01-07 MED ORDER — ROCURONIUM BROMIDE 50 MG/5ML IV SOLN
INTRAVENOUS | Status: AC
  Filled 2014-01-07: qty 1

## 2014-01-07 MED ORDER — DEXTROSE 5 % IV SOLN
1.5000 g | INTRAVENOUS | Status: DC
  Filled 2014-01-07: qty 1.5

## 2014-01-07 MED ORDER — DIPHENHYDRAMINE HCL 12.5 MG/5ML PO ELIX
12.5000 mg | ORAL_SOLUTION | Freq: Four times a day (QID) | ORAL | Status: DC | PRN
  Filled 2014-01-07: qty 5

## 2014-01-07 MED ORDER — 0.9 % SODIUM CHLORIDE (POUR BTL) OPTIME
TOPICAL | Status: DC | PRN
  Administered 2014-01-07: 4000 mL

## 2014-01-07 MED ORDER — LACTATED RINGERS IV SOLN
INTRAVENOUS | Status: DC | PRN
  Administered 2014-01-07: 09:00:00 via INTRAVENOUS

## 2014-01-07 MED ORDER — ENOXAPARIN SODIUM 40 MG/0.4ML ~~LOC~~ SOLN
40.0000 mg | Freq: Every day | SUBCUTANEOUS | Status: DC
  Administered 2014-01-08 – 2014-01-09 (×2): 40 mg via SUBCUTANEOUS
  Filled 2014-01-07 (×3): qty 0.4

## 2014-01-07 MED ORDER — POTASSIUM CHLORIDE 10 MEQ/50ML IV SOLN: 10.0000 meq | Freq: Every day | INTRAVENOUS | Status: DC | PRN

## 2014-01-07 MED ORDER — SODIUM CHLORIDE 0.9 % IJ SOLN: 9.0000 mL | INTRAMUSCULAR | Status: DC | PRN

## 2014-01-07 MED ORDER — PROPOFOL 10 MG/ML IV BOLUS
INTRAVENOUS | Status: AC
  Filled 2014-01-07: qty 20

## 2014-01-07 MED ORDER — ACETAMINOPHEN 160 MG/5ML PO SOLN
1000.0000 mg | Freq: Four times a day (QID) | ORAL | Status: DC
  Administered 2014-01-07: 1000 mg via ORAL
  Filled 2014-01-07 (×3): qty 40

## 2014-01-07 MED ORDER — LACTATED RINGERS IV SOLN
INTRAVENOUS | Status: DC
  Administered 2014-01-07: 09:00:00 via INTRAVENOUS

## 2014-01-07 MED ORDER — ALBUTEROL SULFATE (2.5 MG/3ML) 0.083% IN NEBU
2.5000 mg | INHALATION_SOLUTION | Freq: Four times a day (QID) | RESPIRATORY_TRACT | Status: DC
  Administered 2014-01-07 – 2014-01-08 (×4): 2.5 mg via RESPIRATORY_TRACT
  Filled 2014-01-07 (×4): qty 3

## 2014-01-07 MED ORDER — NEOSTIGMINE METHYLSULFATE 10 MG/10ML IV SOLN
INTRAVENOUS | Status: DC | PRN
  Administered 2014-01-07: 5 mg via INTRAVENOUS

## 2014-01-07 MED ORDER — NALOXONE HCL 0.4 MG/ML IJ SOLN: 0.4000 mg | INTRAMUSCULAR | Status: DC | PRN

## 2014-01-07 MED ORDER — ONDANSETRON HCL 4 MG/2ML IJ SOLN
INTRAMUSCULAR | Status: AC
  Filled 2014-01-07: qty 2

## 2014-01-07 MED ORDER — SENNOSIDES-DOCUSATE SODIUM 8.6-50 MG PO TABS
1.0000 | ORAL_TABLET | Freq: Every day | ORAL | Status: DC
  Administered 2014-01-07 – 2014-01-08 (×2): 1 via ORAL
  Filled 2014-01-07 (×4): qty 1

## 2014-01-07 MED ORDER — KCL IN DEXTROSE-NACL 20-5-0.45 MEQ/L-%-% IV SOLN
INTRAVENOUS | Status: DC
  Administered 2014-01-07 – 2014-01-08 (×2): via INTRAVENOUS
  Filled 2014-01-07 (×4): qty 1000

## 2014-01-07 MED ORDER — INSULIN ASPART 100 UNIT/ML ~~LOC~~ SOLN
0.0000 [IU] | Freq: Four times a day (QID) | SUBCUTANEOUS | Status: DC
Start: 2014-01-07 — End: 2014-01-09
  Administered 2014-01-09: 2 [IU] via SUBCUTANEOUS
  Administered 2014-01-09: 4 [IU] via SUBCUTANEOUS

## 2014-01-07 MED ORDER — DEXTROSE 5 % IV SOLN
1.5000 g | INTRAVENOUS | Status: DC | PRN
  Administered 2014-01-07: 1.5 g via INTRAVENOUS

## 2014-01-07 MED ORDER — ONDANSETRON HCL 4 MG/2ML IJ SOLN: 4.0000 mg | Freq: Once | INTRAMUSCULAR | Status: DC | PRN

## 2014-01-07 MED ORDER — DIPHENHYDRAMINE HCL 50 MG/ML IJ SOLN: 12.5000 mg | Freq: Four times a day (QID) | INTRAMUSCULAR | Status: DC | PRN

## 2014-01-07 SURGICAL SUPPLY — 81 items
APPLICATOR TIP EXT COSEAL (VASCULAR PRODUCTS) IMPLANT
BAG SPEC RTRVL LRG 6X4 10 (ENDOMECHANICALS)
CANISTER SUCTION 2500CC (MISCELLANEOUS) ×4 IMPLANT
CATH KIT ON Q 5IN SLV (PAIN MANAGEMENT) IMPLANT
CATH ROBINSON RED A/P 18FR (CATHETERS) ×4 IMPLANT
CATH THORACIC 28FR (CATHETERS) IMPLANT
CATH THORACIC 28FR RT ANG (CATHETERS) IMPLANT
CATH THORACIC 36FR (CATHETERS) IMPLANT
CATH THORACIC 36FR RT ANG (CATHETERS) IMPLANT
CLIP TI MEDIUM 6 (CLIP) ×4 IMPLANT
CONN ST 1/4X3/8  BEN (MISCELLANEOUS) ×4
CONN ST 1/4X3/8 BEN (MISCELLANEOUS) ×4 IMPLANT
CONN Y 3/8X3/8X3/8  BEN (MISCELLANEOUS) ×4
CONN Y 3/8X3/8X3/8 BEN (MISCELLANEOUS) ×4 IMPLANT
CONT SPEC 4OZ CLIKSEAL STRL BL (MISCELLANEOUS) ×20 IMPLANT
COVER SURGICAL LIGHT HANDLE (MISCELLANEOUS) ×4 IMPLANT
DERMABOND ADHESIVE PROPEN (GAUZE/BANDAGES/DRESSINGS) ×2
DERMABOND ADVANCED (GAUZE/BANDAGES/DRESSINGS) ×2
DERMABOND ADVANCED .7 DNX12 (GAUZE/BANDAGES/DRESSINGS) ×2 IMPLANT
DERMABOND ADVANCED .7 DNX6 (GAUZE/BANDAGES/DRESSINGS) ×2 IMPLANT
DRAIN CHANNEL 32F RND 10.7 FF (WOUND CARE) ×8 IMPLANT
DRAPE C-ARM 42X72 X-RAY (DRAPES) IMPLANT
DRAPE LAPAROSCOPIC ABDOMINAL (DRAPES) ×4 IMPLANT
DRAPE WARM FLUID 44X44 (DRAPE) ×4 IMPLANT
ELECT BLADE 6.5 EXT (BLADE) ×4 IMPLANT
ELECT REM PT RETURN 9FT ADLT (ELECTROSURGICAL) ×4
ELECTRODE REM PT RTRN 9FT ADLT (ELECTROSURGICAL) ×2 IMPLANT
GAUZE SPONGE 4X4 12PLY STRL (GAUZE/BANDAGES/DRESSINGS) ×4 IMPLANT
GLOVE BIO SURGEON STRL SZ 6.5 (GLOVE) ×6 IMPLANT
GLOVE BIO SURGEONS STRL SZ 6.5 (GLOVE) ×2
GLOVE BIOGEL PI IND STRL 6.5 (GLOVE) ×4 IMPLANT
GLOVE BIOGEL PI INDICATOR 6.5 (GLOVE) ×4
GLOVE SURG SIGNA 7.5 PF LTX (GLOVE) ×8 IMPLANT
GLOVE SURG SS PI 7.0 STRL IVOR (GLOVE) ×8 IMPLANT
GOWN STRL REUS W/ TWL LRG LVL3 (GOWN DISPOSABLE) ×4 IMPLANT
GOWN STRL REUS W/ TWL XL LVL3 (GOWN DISPOSABLE) ×2 IMPLANT
GOWN STRL REUS W/TWL LRG LVL3 (GOWN DISPOSABLE) ×6
GOWN STRL REUS W/TWL XL LVL3 (GOWN DISPOSABLE) ×3
HANDLE STAPLE ENDO GIA SHORT (STAPLE)
HEMOSTAT SURGICEL 2X14 (HEMOSTASIS) IMPLANT
KIT BASIN OR (CUSTOM PROCEDURE TRAY) ×4 IMPLANT
KIT PLEURX DRAIN CATH 1000ML (MISCELLANEOUS) IMPLANT
KIT PLEURX DRAIN CATH 15.5FR (DRAIN) ×4 IMPLANT
KIT ROOM TURNOVER OR (KITS) ×4 IMPLANT
NS IRRIG 1000ML POUR BTL (IV SOLUTION) ×12 IMPLANT
PACK CHEST (CUSTOM PROCEDURE TRAY) ×4 IMPLANT
PACK GENERAL/GYN (CUSTOM PROCEDURE TRAY) IMPLANT
PAD ARMBOARD 7.5X6 YLW CONV (MISCELLANEOUS) ×8 IMPLANT
POUCH ENDO CATCH II 15MM (MISCELLANEOUS) IMPLANT
POUCH SPECIMEN RETRIEVAL 10MM (ENDOMECHANICALS) IMPLANT
SEALANT PROGEL (MISCELLANEOUS) IMPLANT
SEALANT SURG COSEAL 4ML (VASCULAR PRODUCTS) IMPLANT
SEALANT SURG COSEAL 8ML (VASCULAR PRODUCTS) IMPLANT
SET DRAINAGE LINE (MISCELLANEOUS) IMPLANT
SOLUTION ANTI FOG 6CC (MISCELLANEOUS) ×4 IMPLANT
SPONGE GAUZE 4X4 12PLY STER LF (GAUZE/BANDAGES/DRESSINGS) ×4 IMPLANT
STAPLER ENDO GIA 12MM SHORT (STAPLE) IMPLANT
SUT ETHILON 3 0 FSL (SUTURE) ×4 IMPLANT
SUT PROLENE 4 0 RB 1 (SUTURE)
SUT PROLENE 4-0 RB1 .5 CRCL 36 (SUTURE) IMPLANT
SUT SILK  1 MH (SUTURE) ×4
SUT SILK 1 MH (SUTURE) ×4 IMPLANT
SUT SILK 1 TIES 10X30 (SUTURE) IMPLANT
SUT SILK 2 0SH CR/8 30 (SUTURE) IMPLANT
SUT SILK 3 0SH CR/8 30 (SUTURE) IMPLANT
SUT VIC AB 1 CTX 36 (SUTURE) ×2
SUT VIC AB 1 CTX36XBRD ANBCTR (SUTURE) ×2 IMPLANT
SUT VIC AB 2-0 CTX 36 (SUTURE) ×4 IMPLANT
SUT VIC AB 3-0 MH 27 (SUTURE) IMPLANT
SUT VIC AB 3-0 X1 27 (SUTURE) ×4 IMPLANT
SUT VICRYL 2 TP 1 (SUTURE) IMPLANT
SYSTEM SAHARA CHEST DRAIN RE-I (WOUND CARE) ×4 IMPLANT
TAPE CLOTH 4X10 WHT NS (GAUZE/BANDAGES/DRESSINGS) ×4 IMPLANT
TIP APPLICATOR SPRAY EXTEND 16 (VASCULAR PRODUCTS) IMPLANT
TOWEL OR 17X24 6PK STRL BLUE (TOWEL DISPOSABLE) ×8 IMPLANT
TOWEL OR 17X26 10 PK STRL BLUE (TOWEL DISPOSABLE) ×8 IMPLANT
TRAP SPECIMEN MUCOUS 40CC (MISCELLANEOUS) ×8 IMPLANT
TRAY FOLEY CATH 16FRSI W/METER (SET/KITS/TRAYS/PACK) ×4 IMPLANT
TROCAR XCEL BLADELESS 5X75MML (TROCAR) ×4 IMPLANT
TUNNELER SHEATH ON-Q 11GX8 DSP (PAIN MANAGEMENT) IMPLANT
WATER STERILE IRR 1000ML POUR (IV SOLUTION) ×8 IMPLANT

## 2014-01-07 NOTE — Brief Op Note (Signed)
01/20/2014 - 12/30/2013  11:33 AM  PATIENT:  Aaron [name]  80 y.o. male  PRE-OPERATIVE DIAGNOSIS:   LEFT PLEURAL EFFUSION  POST-OPERATIVE DIAGNOSIS:  LEFT PLEURAL EFFUSION- malignant melanoma  PROCEDURE:  Procedure(s): Left  VIDEO ASSISTED THORACOSCOPY (VATS) with multiple biopsies(lung,pleural and pericardial fat pad bx's) (Left) DRAINAGE OF PLEURAL EFFUSION (Left) INSERTION PLEURAL DRAINAGE CATHETER (Left)  SURGEON:  Surgeon(s) and Role:    * Melrose Nakayama, MD - Primary  PHYSICIAN ASSISTANT:   ASSISTANTS: none   ANESTHESIA:   general  EBL:  Total I/O In: -  Out: 300 [Urine:200; Blood:100]  BLOOD ADMINISTERED:none  DRAINS: 2 1 F Blake drains and pleur-x catheter in left pleural space   LOCAL MEDICATIONS USED:  NONE  SPECIMEN:  Source of Specimen:  pleural, lung and pericardial fat pad implants  DISPOSITION OF SPECIMEN:  PATHOLOGY  COUNTS:  YES  PLAN OF CARE: Admit to inpatient   PATIENT DISPOSITION:  PACU - hemodynamically stable.   Delay start of Pharmacological VTE agent (>24hrs) due to surgical blood loss or risk of bleeding: yes  Findings: extensive implants involving visceral and parietal pleura and pericardial fat pad. Left lower lobe with extensive involvement 3 L of bloody fluid Frozen c/w melanoma, but need permanent pathology to confirm

## 2014-01-07 NOTE — Addendum Note (Signed)
Addendum  created 12/29/2013 1211 by Clearnce Sorrel, CRNA   Modules edited: Anesthesia Medication Administration

## 2014-01-07 NOTE — Progress Notes (Signed)
Pt alert and oriented, denies pain. Pt left floor via bed for VATZ procedure, will go to stepdown after.

## 2014-01-07 NOTE — Anesthesia Preprocedure Evaluation (Signed)
Anesthesia Evaluation    Airway        Dental   Pulmonary asthma , former smoker,          Cardiovascular hypertension, + dysrhythmias + pacemaker + Valvular Problems/Murmurs AS     Neuro/Psych    GI/Hepatic   Endo/Other    Renal/GU      Musculoskeletal  (+) Arthritis -,   Abdominal   Peds  Hematology  (+) anemia ,   Anesthesia Other Findings Pleural effusion  Reproductive/Obstetrics                             Anesthesia Physical Anesthesia Plan  ASA: III  Anesthesia Plan: General   Post-op Pain Management:    Induction: Intravenous  Airway Management Planned: Double Lumen EBT  Additional Equipment: Arterial line and CVP  Intra-op Plan:   Post-operative Plan: Possible Post-op intubation/ventilation  Informed Consent: I have reviewed the patients History and Physical, chart, labs and discussed the procedure including the risks, benefits and alternatives for the proposed anesthesia with the patient or authorized representative who has indicated his/her understanding and acceptance.     Plan Discussed with: Anesthesiologist, CRNA and Surgeon  Anesthesia Plan Comments:         Anesthesia Quick Evaluation

## 2014-01-07 NOTE — Anesthesia Postprocedure Evaluation (Signed)
  Anesthesia Post-op Note  Patient: Aaron [name]  Procedure(s) Performed: Procedure(s): Left  VIDEO ASSISTED THORACOSCOPY (VATS) with multiple biopsies(lung,pleural and pericardial fat pad bx's) (Left) DRAINAGE OF PLEURAL EFFUSION (Left) INSERTION PLEURAL DRAINAGE CATHETER (Left)  Patient Location: PACU  Anesthesia Type:General  Level of Consciousness: awake, alert , oriented and patient cooperative  Airway and Oxygen Therapy: Patient connected to face mask  Post-op Pain: moderate  Post-op Assessment: Post-op Vital signs reviewed, Patient's Cardiovascular Status Stable, Respiratory Function Stable, Patent Airway, No signs of Nausea or vomiting and Pain level controlled  Post-op Vital Signs: stable  Last Vitals:  Filed Vitals:   01/15/2014 1152  BP: 171/102  Pulse:   Temp:   Resp: 15    Complications: No apparent anesthesia complications

## 2014-01-07 NOTE — OR Nursing (Signed)
1045- 3750 ml bloody pleural fluid drained from left chest.

## 2014-01-07 NOTE — H&P (View-Only) (Signed)
Reason for Consult: Left pleural effusion Referring Physician: Dr. Celedonio Savage  Aaron [name] is an 80 y.o. male.  HPI: 80 yo man with a history of melanoma in 2011 and known moderate AS and numerous other medical problems presents with a cc/o shortness of breath.  Aaron [name] has a history of smoking cigars when younger but never smoked cigarettes. He was in his usual state of health until last Friday when he noted shortness of breath with exertion. He could previously walk about a block, but by Friday walking around his house was difficult. He has had some chest tightness. He gets short of breath trying to lie flat. He laso has had leg swelling right > left although has gotten better with lasix.  He had a chest x-ray in the ED which showed a large left effusion. A CT angio showed no PE but there was a large left effusion and also right lower lobe nodule.  He had thoracentesis on Sunday. 1.3 L was drained. The patient had symptomatic relief but his symptoms worsened again on Monday. The fluid was bloody. Cytology was negative.  Past Medical History  Diagnosis Date  . Hypercholesteremia   . Bilateral hearing loss   . Seasonal allergies   . Asthma   . Cataract   . Melanoma     right foot  . BPH (benign prostatic hyperplasia)   . Essential hypertension, benign   . SOB (shortness of breath)   . Osteoarthrosis, unspecified whether generalized or localized, other specified sites   . Anemia, unspecified   . Systolic murmur     Due to mild aortic stenosis  . Acquired stenosis of aortic valve     ECHO 10/07/12 - Moderate aortic valve stenosis. EF estimated at 50-55%.  . Cardiac pacemaker in situ     DDD pacer 04/2006 by Dr. Tamala Julian  . AV block     DDD pacer 04/2006 by Dr. Tamala Julian  . Sinoatrial node dysfunction   . Gout   . Macular degeneration     Dr. Jon Billings  . Pre-syncope 2008  . Dyspnea 09/17/12  . Swelling of both lower extremities 09/17/12  . Mild concentric left ventricular hypertrophy  (LVH)     By ECHO 10/07/12  . Mitral annular calcification     Moderate. By ECHO 10/07/12    Past Surgical History  Procedure Laterality Date  . Pacemaker insertion  05/15/06    Dual chamber permanent transvenous pacemaker  . Transurethral resection of prostate  2001  . Total knee arthroplasty  2006  . Melanoma excision right foot      [name] History  Problem Relation Age of Onset  . Heart disease Mother   . CAD Mother   . Heart disease Father   . CAD Father   . Cancer Sister     breast    Social History:  reports that he quit smoking about 25 years ago. His smoking use included Cigars. He has never used smokeless tobacco. He reports that he drinks alcohol. He reports that he does not use illicit drugs.  Allergies:  Allergies  Allergen Reactions  . Hydrochlorothiazide Other (See Comments)    ? Causes gout  . Oxycodone Hcl Nausea And Vomiting    Medications:  Scheduled: . allopurinol  200 mg Oral Daily  . amLODipine  5 mg Oral Daily  . antiseptic oral rinse  7 mL Mouth Rinse BID  . aspirin EC  81 mg Oral Daily  . carvedilol  6.25 mg  Oral BID  . heparin  5,000 Units Subcutaneous 3 times per day  . HYDROcodone-acetaminophen  1 tablet Oral 3 times per day  . mometasone-formoterol  2 puff Inhalation BID  . potassium chloride  40 mEq Oral BID  . sorbitol  30 mL Oral BID    Results for orders placed or performed during the hospital encounter of 01/13/2014 (from the past 48 hour(s))  Basic metabolic panel     Status: Abnormal   Collection Time: 01/05/14  3:13 AM  Result Value Ref Range   Sodium 137 137 - 147 mEq/L   Potassium 3.4 (L) 3.7 - 5.3 mEq/L   Chloride 99 96 - 112 mEq/L   CO2 25 19 - 32 mEq/L   Glucose, Bld 109 (H) 70 - 99 mg/dL   BUN 16 6 - 23 mg/dL   Creatinine, Ser 1.01 0.50 - 1.35 mg/dL   Calcium 8.3 (L) 8.4 - 10.5 mg/dL   GFR calc non Af Amer 67 (L) >90 mL/min   GFR calc Af Amer 78 (L) >90 mL/min    Comment: (NOTE) The eGFR has been calculated using the  CKD EPI equation. This calculation has not been validated in all clinical situations. eGFR's persistently <90 mL/min signify possible Chronic Kidney Disease.    Anion gap 13 5 - 15    Dg Chest 2 View  01/04/2014   CLINICAL DATA:  Three-day history of difficulty breathing  EXAM: CHEST  2 VIEW  COMPARISON:  January 03, 2014  FINDINGS: There is persistent left effusion with left base consolidation. Right lung is clear. Heart is mildly enlarged with pulmonary vascularity within normal limits. Pacemaker leads are attached to the right atrium and right ventricle. No adenopathy. No pneumothorax. There is degenerative change in the lower thoracic spine.  IMPRESSION: Persistent left effusion with left base consolidation. Right lung clear. Stable cardiac prominence.   Electronically Signed   By: Lowella Grip M.D.   On: 01/04/2014 16:56   CT ANGIOGRAPHY CHEST WITH CONTRAST  TECHNIQUE: Multidetector CT imaging of the chest was performed using the standard protocol during bolus administration of intravenous contrast. Multiplanar CT image reconstructions and MIPs were obtained to evaluate the vascular anatomy.  CONTRAST: 122m OMNIPAQUE IOHEXOL 350 MG/ML SOLN  COMPARISON: Current chest radiograph.  FINDINGS: Motion degrades the images limiting evaluation for segmental and subsegmental pulmonary emboli. Allowing for this limitation, there is no evidence a pulmonary embolus.  Heart is normal in size and configuration. There are changes from previous cardiac surgery and mitral valve replacement. Sequential pacemaker is well positioned with its leads in the right atrium and right ventricle.  Great vessels are normal in caliber. No aortic dissection. No mediastinal or hilar masses or pathologically enlarged lymph nodes.  There is a large left pleural effusion. It is most of the left lower lobe is collapsed from compressive atelectasis. There is milder atelectasis along the  posterior left upper lobe. No evidence of a centrally obstructing endobronchial or hilar mass. No right effusion. No pulmonary edema.  Well-defined, 2.4 cm x 1.7 cm, soft tissue mass along the bronchovascular structures of the left lower lobe. No other lung masses. No convincing pneumonia.  Mild degenerative changes are noted throughout the visualized spine. No osteoblastic or osteolytic lesions.  Review of the MIP images confirms the above findings.  IMPRESSION: 1. No evidence of a pulmonary embolus. Exam limited by motion as detailed above. 2. Large left pleural effusion with near complete atelectasis of the left lower lobe. There is  milder atelectasis along the posterior left upper lobe. 3. 2.4 cm well-defined soft tissue mass in the right lower lobe adjacent to the segmental bronchovascular structures. This is nonspecific. Neoplastic disease must be considered in the differential diagnosis. Consider biopsy or follow-up PET-CT. 4. No convincing pneumonia or pulmonary edema.   Electronically Signed  By: Lajean Manes M.D.  On: 01/04/2014 12:59   Review of Systems  Constitutional: Negative for fever and chills.  Respiratory: Positive for cough and shortness of breath.   Cardiovascular: Positive for leg swelling. Negative for chest pain.  All other systems reviewed and are negative.  Blood pressure 130/61, pulse 72, temperature 98.7 F (37.1 C), temperature source Oral, resp. rate 26, height 6' 1"  (1.854 m), weight 288 lb 5.8 oz (130.8 kg), SpO2 97 %. Physical Exam  Vitals reviewed. Constitutional: He is oriented to person, place, and time. He appears distressed (mild, respiratory).  obese  HENT:  Head: Normocephalic and atraumatic.  Eyes: EOM are normal. Pupils are equal, round, and reactive to light.  Neck: Neck supple. No thyromegaly present.  Cardiovascular: Normal rate and regular rhythm.   Murmur (1-1/5 systolic RUSB) heard. Respiratory: He has no  wheezes. He has no rales.  Increased WOB, absent BS left base  GI: Soft. There is no tenderness.  Musculoskeletal: He exhibits edema (2+ R, 1+ L).  Lymphadenopathy:    He has no cervical adenopathy.  Neurological: He is alert and oriented to person, place, and time. No cranial nerve deficit.  Skin: Skin is warm and dry.    Assessment/Plan: 80 yo man who presents with shortness of breath and has a large left effusion. The fluid was bloody which is concerning given his history of melanoma. There also is a possibility the effusion is infectious or inflammatory in nature, but the suspicion for malignancy is high.  He did get symptomatic relief form thoracentesis.  I recommended that we proceed with a left VATS to drain the effusion, possible decortication, and possible pleural catheter placement.   I discussed the general nature of the procedure, the need for general anesthesia, and the incisions to be used with Aaron [name]. We discussed the expected hospital stay, overall recovery and short and long term outcomes. They understand the risks include, but are not limited to death, stroke, MI, DVT/PE, bleeding, possible need for transfusion, infections, prolonged air leaks, and other organ system dysfunction including respiratory, renal, or GI complications. He understands we will leave in a pleural drainage catheter which will require care after discharge.  He accepts the risks and agrees to proceed  For OR tomorrow  Valeri Sula C 01/06/2014, 4:10 PM

## 2014-01-07 NOTE — Anesthesia Procedure Notes (Addendum)
Performed by: Kate Sable Grade View: Grade I Tube type: Oral Endobronchial tube: EBT position confirmed by fiberoptic bronchoscope and Left and 39 Fr Number of attempts: 1   Procedure Name: Intubation Date/Time: 12/26/2013 9:55 AM Performed by: Clearnce Sorrel Pre-anesthesia Checklist: Patient identified, Timeout performed, Emergency Drugs available, Suction available and Patient being monitored Patient Re-evaluated:Patient Re-evaluated prior to inductionOxygen Delivery Method: Circle system utilized Preoxygenation: Pre-oxygenation with 100% oxygen Intubation Type: IV induction Ventilation: Mask ventilation without difficulty Laryngoscope Size: Mac and 4 Grade View: Grade I Tube type: Oral Endobronchial tube: Left and Double lumen EBT and 39 Fr Number of attempts: 1 Airway Equipment and Method: LTA kit utilized Placement Confirmation: ETT inserted through vocal cords under direct vision,  positive ETCO2 and breath sounds checked- equal and bilateral Tube secured with: Tape Dental Injury: Teeth and Oropharynx as per pre-operative assessment

## 2014-01-07 NOTE — Progress Notes (Signed)
Utilization review completed.  

## 2014-01-07 NOTE — Progress Notes (Signed)
Physical Therapy Note  Patient to OR for procedure. Will follow up with patient for continued therapy at a later time. Likely tomorrow (12/18), as indicated.    [address] [place], [place]

## 2014-01-07 NOTE — Progress Notes (Signed)
         Left VATS

## 2014-01-07 NOTE — Transfer of Care (Signed)
Immediate Anesthesia Transfer of Care Note  Patient: Aaron [name]  Procedure(s) Performed: Procedure(s): Left  VIDEO ASSISTED THORACOSCOPY (VATS) with multiple biopsies(lung,pleural and pericardial fat pad bx's) (Left) DRAINAGE OF PLEURAL EFFUSION (Left) INSERTION PLEURAL DRAINAGE CATHETER (Left)  Patient Location: PACU  Anesthesia Type:General  Level of Consciousness: awake, alert  and oriented  Airway & Oxygen Therapy: Patient Spontanous Breathing  Post-op Assessment: Report given to PACU RN  Post vital signs: Reviewed and stable  Complications: No apparent anesthesia complications

## 2014-01-07 NOTE — Interval H&P Note (Signed)
History and Physical Interval Note:  12/22/2013 9:01 AM  Aaron [name]  has presented today for surgery, with the diagnosis of LEFT PLEURAL EFFUSION  The various methods of treatment have been discussed with the patient and family. After consideration of risks, benefits and other options for treatment, the patient has consented to  Procedure(s): VIDEO ASSISTED THORACOSCOPY (VATS)/DECORTICATION (Left) DRAINAGE OF PLEURAL EFFUSION (Left) INSERTION PLEURAL DRAINAGE CATHETER (Left) as a surgical intervention .  The patient's history has been reviewed, patient examined, no change in status, stable for surgery.  I have reviewed the patient's chart and labs.  Questions were answered to the patient's satisfaction.     [name] C

## 2014-01-08 ENCOUNTER — Inpatient Hospital Stay (HOSPITAL_COMMUNITY): Payer: Medicare Other

## 2014-01-08 ENCOUNTER — Encounter (HOSPITAL_COMMUNITY): Payer: Self-pay | Admitting: Thoracic Surgery (Cardiothoracic Vascular Surgery)

## 2014-01-08 DIAGNOSIS — R0689 Other abnormalities of breathing: Secondary | ICD-10-CM

## 2014-01-08 DIAGNOSIS — R0602 Shortness of breath: Secondary | ICD-10-CM

## 2014-01-08 DIAGNOSIS — Z515 Encounter for palliative care: Secondary | ICD-10-CM

## 2014-01-08 DIAGNOSIS — J910 Malignant pleural effusion: Secondary | ICD-10-CM

## 2014-01-08 DIAGNOSIS — R0600 Dyspnea, unspecified: Secondary | ICD-10-CM | POA: Diagnosis present

## 2014-01-08 LAB — CBC
HEMATOCRIT: 24 % — AB (ref 39.0–52.0)
Hemoglobin: 7.5 g/dL — ABNORMAL LOW (ref 13.0–17.0)
MCH: 30.9 pg (ref 26.0–34.0)
MCHC: 31.3 g/dL (ref 30.0–36.0)
MCV: 98.8 fL (ref 78.0–100.0)
Platelets: 327 10*3/uL (ref 150–400)
RBC: 2.43 MIL/uL — ABNORMAL LOW (ref 4.22–5.81)
RDW: 15 % (ref 11.5–15.5)
WBC: 20 10*3/uL — AB (ref 4.0–10.5)

## 2014-01-08 LAB — BLOOD GAS, ARTERIAL
Acid-base deficit: 1.7 mmol/L (ref 0.0–2.0)
BICARBONATE: 22.8 meq/L (ref 20.0–24.0)
DRAWN BY: 41977
FIO2: 0.32 %
O2 Saturation: 89 %
PATIENT TEMPERATURE: 98.6
PH ART: 7.371 (ref 7.350–7.450)
TCO2: 24.1 mmol/L (ref 0–100)
pCO2 arterial: 40.4 mmHg (ref 35.0–45.0)
pO2, Arterial: 60.9 mmHg — ABNORMAL LOW (ref 80.0–100.0)

## 2014-01-08 LAB — CULTURE, BLOOD (ROUTINE X 2)
Culture: NO GROWTH
Culture: NO GROWTH

## 2014-01-08 LAB — BASIC METABOLIC PANEL
Anion gap: 11 (ref 5–15)
BUN: 18 mg/dL (ref 6–23)
CHLORIDE: 101 meq/L (ref 96–112)
CO2: 23 mEq/L (ref 19–32)
CREATININE: 1.34 mg/dL (ref 0.50–1.35)
Calcium: 8.2 mg/dL — ABNORMAL LOW (ref 8.4–10.5)
GFR calc non Af Amer: 48 mL/min — ABNORMAL LOW (ref >90)
GFR, EST AFRICAN AMERICAN: 55 mL/min — AB (ref >90)
Glucose, Bld: 140 mg/dL — ABNORMAL HIGH (ref 70–99)
Potassium: 5.9 mEq/L — ABNORMAL HIGH (ref 3.7–5.3)
Sodium: 135 mEq/L — ABNORMAL LOW (ref 137–147)

## 2014-01-08 LAB — GLUCOSE, CAPILLARY
GLUCOSE-CAPILLARY: 144 mg/dL — AB (ref 70–99)
GLUCOSE-CAPILLARY: 133 mg/dL — AB (ref 70–99)
GLUCOSE-CAPILLARY: 136 mg/dL — AB (ref 70–99)
Glucose-Capillary: 142 mg/dL — ABNORMAL HIGH (ref 70–99)

## 2014-01-08 MED ORDER — IOHEXOL 300 MG/ML  SOLN
80.0000 mL | Freq: Once | INTRAMUSCULAR | Status: AC | PRN
  Administered 2014-01-08: 80 mL via INTRAVENOUS

## 2014-01-08 MED ORDER — SODIUM POLYSTYRENE SULFONATE 15 GM/60ML PO SUSP
30.0000 g | Freq: Once | ORAL | Status: AC
  Administered 2014-01-08: 30 g via ORAL
  Filled 2014-01-08: qty 120

## 2014-01-08 MED ORDER — SODIUM CHLORIDE 0.9 % IV SOLN
INTRAVENOUS | Status: AC
  Administered 2014-01-08 (×2): via INTRAVENOUS

## 2014-01-08 MED ORDER — ALBUTEROL SULFATE (2.5 MG/3ML) 0.083% IN NEBU
2.5000 mg | INHALATION_SOLUTION | Freq: Three times a day (TID) | RESPIRATORY_TRACT | Status: DC
  Administered 2014-01-08 – 2014-01-09 (×2): 2.5 mg via RESPIRATORY_TRACT
  Filled 2014-01-08 (×5): qty 3

## 2014-01-08 NOTE — Care Management Note (Unsigned)
    Page 1 of 1   01/08/2014     2:44:43 PM CARE MANAGEMENT NOTE 01/08/2014  Patient:  Aaron [name], Aaron [name]   Account Number:  [id number]  Date Initiated:  01/10/2014  Documentation initiated by:  Marvetta Gibbons  Subjective/Objective Assessment:   Pt admitted with pl effusion s/p thoracentisis     Action/Plan:   PTA pt lived at home   Anticipated DC Date:  2014/02/06   Anticipated DC Plan:  [place]  CM consult      Choice offered to / List presented to:             Status of service:  In process, will continue to follow Medicare Important Message given?  YES (If response is "NO", the following Medicare IM given date fields will be blank) Date Medicare IM given:  01/08/2014 Medicare IM given by:  GRAVES-BIGELOW,Kamarii Buren Date Additional Medicare IM given:   Additional Medicare IM given by:    Discharge Disposition:    Per UR Regulation:  Reviewed for med. necessity/level of care/duration of stay  If discussed at [place] of Stay Meetings, dates discussed:   01/13/2014    Comments:  01-08-14 1442 Jacqlyn Krauss, RN,BSN [phone] Pt s/p VATS-continues with PCA- CT to water seal. CM to f/u for disposition needs.  01/10/2014- 1000- Marvetta Gibbons RN, BSN [phone] Pt for VATS today, NCM to follow post op for d/c needs

## 2014-01-08 NOTE — Consult Note (Signed)
[place] CONSULT NOTE  Patient Care Team: [name], MD as PCP - General (Internal Medicine) Sinclair Grooms, MD as Referring Physician (Cardiology)  CHIEF COMPLAINTS/PURPOSE OF CONSULTATION:  Metastatic melanoma  HISTORY OF PRESENTING ILLNESS:  Aaron [name] 80 y.o. male is here because of large pleural effusion related to metastatic melanoma. He has a prior history of melanoma 5 years ago that was resected from his right foot at [facility] followed by skin grafting and prolonged time to heal. He even had a sentinel lymph node done at that time which was negative. Subsequently had done extremely well for the past 5 years. He came in complaining of shortness of breath and was found to have large left pleural effusion. He underwent VATS followed by Pleurx catheter placement and a chest tube placement. Dr. Roxan Hockey reported that he had extensive nodules in the left lung as well as the pericardium. Biopsy of these lesions came back as metastatic melanoma. We are consulted to assist with oncology care. Patient is accompanied by his daughter and his room.  MEDICAL HISTORY:  Past Medical History  Diagnosis Date  . Hypercholesteremia   . Bilateral hearing loss   . Seasonal allergies   . Asthma   . Cataract   . Melanoma     right foot  . BPH (benign prostatic hyperplasia)   . Essential hypertension, benign   . SOB (shortness of breath)   . Osteoarthrosis, unspecified whether generalized or localized, other specified sites   . Anemia, unspecified   . Systolic murmur     Due to mild aortic stenosis  . Acquired stenosis of aortic valve     ECHO 10/07/12 - Moderate aortic valve stenosis. EF estimated at 50-55%.  . Cardiac pacemaker in situ     DDD pacer 04/2006 by Dr. Tamala Julian  . AV block     DDD pacer 04/2006 by Dr. Tamala Julian  . Sinoatrial node dysfunction   . Gout   . Macular degeneration     Dr. Jon Billings  . Pre-syncope 2008  . Dyspnea 09/17/12  . Swelling of both  lower extremities 09/17/12  . Mild concentric left ventricular hypertrophy (LVH)     By ECHO 10/07/12  . Mitral annular calcification     Moderate. By ECHO 10/07/12    SURGICAL HISTORY: Past Surgical History  Procedure Laterality Date  . Pacemaker insertion  05/15/06    Dual chamber permanent transvenous pacemaker  . Transurethral resection of prostate  2001  . Total knee arthroplasty  2006  . Melanoma excision right foot    . Video assisted thoracoscopy (vats)/decortication Left 01/01/2014    Procedure: Left  VIDEO ASSISTED THORACOSCOPY (VATS) with multiple biopsies(lung,pleural and pericardial fat pad bx's);  Surgeon: Melrose Nakayama, MD;  Location: [place];  Service: Thoracic;  Laterality: Left;  . Pleural effusion drainage Left 01/12/2014    Procedure: DRAINAGE OF PLEURAL EFFUSION;  Surgeon: Melrose Nakayama, MD;  Location: [place];  Service: Thoracic;  Laterality: Left;  . Chest tube insertion Left 12/31/2013    Procedure: INSERTION PLEURAL DRAINAGE CATHETER;  Surgeon: Melrose Nakayama, MD;  Location: [place];  Service: Thoracic;  Laterality: Left;    SOCIAL HISTORY: History   Social History  . Marital Status: Divorced    Spouse Name: N/A    Number of Children: N/A  . Years of Education: N/A   Occupational History  . Not on file.   Social History Main Topics  . Smoking  status: Former Smoker    Types: Cigars    Quit date: 01/23/1988  . Smokeless tobacco: Never Used  . Alcohol Use: Yes     Comment: socially  . Drug Use: No  . Sexual Activity: No   Other Topics Concern  . Not on file   Social History Narrative    FAMILY HISTORY: Family History  Problem Relation Age of Onset  . Heart disease Mother   . CAD Mother   . Heart disease Father   . CAD Father   . Cancer Sister     breast    ALLERGIES:  is allergic to hydrochlorothiazide and oxycodone hcl.  MEDICATIONS:  Current Facility-Administered Medications  Medication Dose Route Frequency Provider  Last Rate Last Dose  . 0.9 %  sodium chloride infusion   Intravenous Continuous Charlynne Cousins, MD 75 mL/hr at 01/08/14 [phone]    . albuterol (PROVENTIL) (2.5 MG/3ML) 0.083% nebulizer solution 2.5 mg  2.5 mg Nebulization TID Charlynne Cousins, MD      . allopurinol (ZYLOPRIM) tablet 200 mg  200 mg Oral Daily Charlynne Cousins, MD   200 mg at 01/08/14 1016  . amLODipine (NORVASC) tablet 5 mg  5 mg Oral Daily Charlynne Cousins, MD   5 mg at 01/08/14 1016  . antiseptic oral rinse (CPC / CETYLPYRIDINIUM CHLORIDE 0.05%) solution 7 mL  7 mL Mouth Rinse BID Charlynne Cousins, MD   7 mL at 01/08/14 1016  . aspirin EC tablet 81 mg  81 mg Oral Daily Charlynne Cousins, MD   81 mg at 01/08/14 1016  . bisacodyl (DULCOLAX) EC tablet 10 mg  10 mg Oral Daily Melrose Nakayama, MD   10 mg at 01/08/14 1016  . carvedilol (COREG) tablet 6.25 mg  6.25 mg Oral BID Charlynne Cousins, MD   6.25 mg at 01/08/14 1016  . diphenhydrAMINE (BENADRYL) injection 12.5 mg  12.5 mg Intravenous Q6H PRN Melrose Nakayama, MD       Or  . diphenhydrAMINE (BENADRYL) 12.5 MG/5ML elixir 12.5 mg  12.5 mg Oral Q6H PRN Melrose Nakayama, MD      . enoxaparin (LOVENOX) injection 40 mg  40 mg Subcutaneous Daily Melrose Nakayama, MD   40 mg at 01/08/14 1016  . HYDROcodone-acetaminophen (NORCO/VICODIN) 5-325 MG per tablet 1 tablet  1 tablet Oral 3 times per day Charlynne Cousins, MD   1 tablet at 01/08/14 1118  . insulin aspart (novoLOG) injection 0-24 Units  0-24 Units Subcutaneous 4 times per day Melrose Nakayama, MD   0 Units at 01/09/2014 1800  . methocarbamol (ROBAXIN) tablet 500 mg  500 mg Oral Q8H PRN Charlynne Cousins, MD   500 mg at 01/06/14 2234  . mometasone-formoterol (DULERA) 200-5 MCG/ACT inhaler 2 puff  2 puff Inhalation BID Charlynne Cousins, MD   2 puff at 01/08/14 0850  . morphine 1 MG/ML PCA injection   Intravenous 6 times per day Melrose Nakayama, MD      . naloxone [facility]) injection 0.4  mg  0.4 mg Intravenous PRN Melrose Nakayama, MD       And  . sodium chloride 0.9 % injection 9 mL  9 mL Intravenous PRN Melrose Nakayama, MD      . ondansetron [facility]) tablet 4 mg  4 mg Oral Q6H PRN Charlynne Cousins, MD       Or  . ondansetron [facility]) injection 4 mg  4 mg Intravenous  Q6H PRN Charlynne Cousins, MD      . polyethylene glycol [facility] Floria Raveling) packet 17 g  17 g Oral Daily PRN Charlynne Cousins, MD   17 g at 01/05/14 1009  . potassium chloride 10 mEq in 50 mL *CENTRAL LINE* IVPB  10 mEq Intravenous Daily PRN Melrose Nakayama, MD      . senna-docusate (Senokot-S) tablet 1 tablet  1 tablet Oral QHS Melrose Nakayama, MD   1 tablet at 01/17/2014 2256  . traMADol (ULTRAM) tablet 50-100 mg  50-100 mg Oral Q6H PRN Melrose Nakayama, MD   100 mg at 12/23/2013 2003    REVIEW OF SYSTEMS:   Constitutional: Denies fevers, chills or abnormal night sweats Respiratory: Shortness of breath Cardiovascular: Left chest discomfort Gastrointestinal:  Denies nausea, heartburn or change in bowel habits Skin: Denies abnormal skin rashes Neurological:Denies numbness, tingling or new weaknesses Behavioral/Psych: Mood is stable, no new changes  All other systems were reviewed with the patient and are negative.  PHYSICAL EXAMINATION: ECOG PERFORMANCE STATUS: 3 - Symptomatic, >50% confined to bed  Filed Vitals:   01/08/14 1500  BP:   Pulse:   Temp: 98.3 F (36.8 C)  Resp:    Filed Weights   12/29/2013 0438 12/25/2013 1536 01/08/14 0404  Weight: 286 lb 8 oz (129.956 kg) 292 lb 15.9 oz (132.9 kg) 290 lb 12.6 oz (131.9 kg)    GENERAL:alert, shortness of breath with oxygen  OROPHARYNX: Oral mucosa is dry  LUNGS: Diminished breath sounds on the left side HEART: Tachycardia PSYCH: alert & oriented x 3 with fluent speech NEURO: no focal motor/sensory deficits Extremities: No edema   LABORATORY DATA:  I have reviewed the data as listed Lab Results  Component Value Date    WBC 20.0* 01/08/2014   HGB 7.5* 01/08/2014   HCT 24.0* 01/08/2014   MCV 98.8 01/08/2014   PLT 327 01/08/2014   Lab Results  Component Value Date   NA 135* 01/08/2014   K 5.9* 01/08/2014   CL 101 01/08/2014   CO2 23 01/08/2014    RADIOGRAPHIC STUDIES: I have personally reviewed the radiological reports and agreed with the findings in the report. CT chest 12/26/2013: Large left pleural effusion with complete atelectasis of the left lower lobe, 2.4 similar soft tissue density in the right lower lobe   CT abdomen and pelvis is been ordered and results are pending   ASSESSMENT AND PLAN:   1. Metastatic melanoma: I discussed with the patient and his daughter that metastatic melanoma is an incurable disease. He has stage IV disease. Full staging evaluation has not been completed. CT of abdomen and pelvis could be useful to assess if he has any abdominal disease. I discussed with him that the approach to metastatic melanoma would be to send tissue for BRAF mutation testing. If he has a [place] F mutation, he would be a candidate for demographics and vemurafenib. If he does not have this mutation, he would be a candidate for immunotherapy with Ipilimumab.  I discussed with him that BRAF testing will take at least a couple of weeks. In the interim we will see if his clinical condition improves. I discussed with his daughter separately that if his physical condition does not improve, then hospice care would be recommended.  I will request pathology to send for BRAF testing.   Rulon Eisenmenger, MD @T @ 6:07 PM

## 2014-01-08 NOTE — Consult Note (Signed)
Patient Aaron [name]      DOB: 24-Aug-1929      YQI:[id number]     Consult Note from the Palliative Medicine Team at [place] Requested by: Dr. Aileen Fass     PCP: [name], MD Reason for Consultation: [place]       Phone Number:[phone]  Assessment of patients Current state: I met today with Mr. Norrod and his daughter, Jackelyn Poling, at bedside. Mr. Gibbon is very suspicious and asking why he needs palliative care and what I can do for him - I explained that I am to help support him, help with decisions that need to be made, explore worries/concerns/fears, and help manage any symptoms. Explained my goal is to find out what is most important to him and help him gain the best quality of life he can have. He tells me that his goal is to keep living and he is scared of dying. I asked him what he feared most about dying and he said he isn't ready to leave his family. Debbie explained further that they have a family owned funeral home that Mr. 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 They are hopeful to get him back up on his feet so they can get home to celebrate Christmas and after Christmas follow up with his melenoma specialist at [facility] to find out his options. Mr. Zeitz is very clear he wants to pursue aggressive measures to extend his length of life at this point as well as improve quality. This is understandable as he was very independent and living life to the fullest up until now. Mr. Jaber was clearly not interested in discussing any other alternatives other than continued aggressive care at this stage as he continues to ask why he needs palliative care and does not see the value. He ends our conversation by saying he has visitors coming but says I can check on him on Monday.    Goals of Care: 1.  Code Status: FULL - did not address code status specifically today.    2. Scope of Treatment: Continue all  available and offered medical interventions.    4. Disposition: Home when stable. Family available to assist him as needed.    3. Symptom Management:   1. Dyspnea: Continue oxygen and may consider low dose morphine to ease dyspnea.   4. Psychosocial: Emotional support provided to patient and family.   5. 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    Brief HPI: 80 yo male admitted with shortness of breath r/t malignant pleural effusion from metastatic malignant melanoma. VATS completed yesterday. PMH significant for bilateral hearing loss, asthma, cataract, right foot melanoma, BPH, systolic murmur, aortic stenosis, AV block, SA node, gout, macular degeneration, dyspnea, BLE edema, s/p pacemaker.    ROS: + dyspnea    PMH:  Past Medical History  Diagnosis Date  . Hypercholesteremia   . Bilateral hearing loss   . Seasonal allergies   . Asthma   . Cataract   . Melanoma     right foot  . BPH (benign prostatic hyperplasia)   . Essential hypertension, benign   . SOB (shortness of breath)   . Osteoarthrosis, unspecified whether generalized or localized, other specified sites   . Anemia, unspecified   . Systolic murmur     Due to mild aortic stenosis  . Acquired stenosis of aortic valve  ECHO 10/07/12 - Moderate aortic valve stenosis. EF estimated at 50-55%.  . Cardiac pacemaker in situ     DDD pacer 04/2006 by Dr. Tamala Julian  . AV block     DDD pacer 04/2006 by Dr. Tamala Julian  . Sinoatrial node dysfunction   . Gout   . Macular degeneration     Dr. Jon Billings  . Pre-syncope 2008  . Dyspnea 09/17/12  . Swelling of both lower extremities 09/17/12  . Mild concentric left ventricular hypertrophy (LVH)     By ECHO 10/07/12  . Mitral annular calcification     Moderate. By ECHO 10/07/12     PSH: Past Surgical History  Procedure Laterality Date  . Pacemaker insertion  05/15/06    Dual chamber permanent transvenous pacemaker  . Transurethral resection of prostate  2001  .  Total knee arthroplasty  2006  . Melanoma excision right foot    . Video assisted thoracoscopy (vats)/decortication Left 01/13/2014    Procedure: Left  VIDEO ASSISTED THORACOSCOPY (VATS) with multiple biopsies(lung,pleural and pericardial fat pad bx's);  Surgeon: Melrose Nakayama, MD;  Location: [place];  Service: Thoracic;  Laterality: Left;  . Pleural effusion drainage Left 01/14/2014    Procedure: DRAINAGE OF PLEURAL EFFUSION;  Surgeon: Melrose Nakayama, MD;  Location: [place];  Service: Thoracic;  Laterality: Left;  . Chest tube insertion Left 01/10/2014    Procedure: INSERTION PLEURAL DRAINAGE CATHETER;  Surgeon: Melrose Nakayama, MD;  Location: [place];  Service: Thoracic;  Laterality: Left;   I have reviewed the [place] and SH and  If appropriate update it with new information. Allergies  Allergen Reactions  . Hydrochlorothiazide Other (See Comments)    ? Causes gout  . Oxycodone Hcl Nausea And Vomiting   Scheduled Meds: . albuterol  2.5 mg Nebulization TID  . allopurinol  200 mg Oral Daily  . amLODipine  5 mg Oral Daily  . antiseptic oral rinse  7 mL Mouth Rinse BID  . aspirin EC  81 mg Oral Daily  . bisacodyl  10 mg Oral Daily  . carvedilol  6.25 mg Oral BID  . enoxaparin (LOVENOX) injection  40 mg Subcutaneous Daily  . HYDROcodone-acetaminophen  1 tablet Oral 3 times per day  . insulin aspart  0-24 Units Subcutaneous 4 times per day  . mometasone-formoterol  2 puff Inhalation BID  . morphine   Intravenous 6 times per day  . senna-docusate  1 tablet Oral QHS   Continuous Infusions: . sodium chloride 75 mL/hr at 01/08/14 0842   PRN Meds:.diphenhydrAMINE **OR** diphenhydrAMINE, methocarbamol, naloxone **AND** sodium chloride, ondansetron **OR** ondansetron (ZOFRAN) IV, polyethylene glycol, potassium chloride, traMADol    BP 146/59 mmHg  Pulse 97  Temp(Src) 98.4 F (36.9 C) (Oral)  Resp 12  Ht _0  (1.88 m)  Wt 131.9 kg (290 lb 12.6 oz)  BMI 37.32 kg/m2  SpO2 93%    PPS: 40%   Intake/Output Summary (Last 24 hours) at 01/08/14 1455 Last data filed at 01/08/14 1400  Gross per 24 hour  Intake  757.5 ml  Output   1525 ml  Net -767.5 ml   LBM: 01/06/14  Physical Exam:  General: NAD, sitting in chair HEENT: [place]/AT, no JVD, moist mucous membranes Chest: CTA throughout, slightly labored with activity and conversation, symmetric CVS: RRR  Abdomen: Soft, NT, ND, +BS Ext: MAE, BLE 2+ edema, warm to touch Neuro: Awake, alert, oriented x 3  Labs: CBC    Component Value Date/Time   WBC  20.0* 01/08/2014 0318   RBC 2.43* 01/08/2014 0318   HGB 7.5* 01/08/2014 0318   HCT 24.0* 01/08/2014 0318   PLT 327 01/08/2014 0318   MCV 98.8 01/08/2014 0318   MCH 30.9 01/08/2014 0318   MCHC 31.3 01/08/2014 0318   RDW 15.0 01/08/2014 0318   LYMPHSABS 2.2 01/01/2014 0930   MONOABS 1.4* 01/01/2014 0930   EOSABS 0.1 01/18/2014 0930   BASOSABS 0.1 12/22/2013 0930    BMET    Component Value Date/Time   NA 135* 01/08/2014 0318   K 5.9* 01/08/2014 0318   CL 101 01/08/2014 0318   CO2 23 01/08/2014 0318   GLUCOSE 140* 01/08/2014 0318   BUN 18 01/08/2014 0318   CREATININE 1.34 01/08/2014 0318   CALCIUM 8.2* 01/08/2014 0318   GFRNONAA 48* 01/08/2014 0318   GFRAA 55* 01/08/2014 0318    CMP     Component Value Date/Time   NA 135* 01/08/2014 0318   K 5.9* 01/08/2014 0318   CL 101 01/08/2014 0318   CO2 23 01/08/2014 0318   GLUCOSE 140* 01/08/2014 0318   BUN 18 01/08/2014 0318   CREATININE 1.34 01/08/2014 0318   CALCIUM 8.2* 01/08/2014 0318   PROT 6.5 01/03/2014 1200   ALBUMIN 3.2* 01/03/2014 1200   AST 21 01/03/2014 1200   ALT 17 01/03/2014 1200   ALKPHOS 68 01/03/2014 1200   BILITOT 0.3 01/03/2014 1200   GFRNONAA 48* 01/08/2014 0318   GFRAA 55* 01/08/2014 0318     Time In Time Out Total Time Spent with Patient Total Overall Time  1400 1510 35mn 766m    Greater than 50%  of this time was spent counseling and coordinating care related to  the above assessment and plan.  AlVinie SillNP Palliative Medicine Team Pager # [phone]-F [id number]) Team Phone # [phone]/Weekends)

## 2014-01-08 NOTE — Progress Notes (Deleted)
Placed patient on 50% venturi mask due to low Sp02. Nasal tracheal suctioned patient down left nair for copious amount of thick yellow.

## 2014-01-08 NOTE — Progress Notes (Signed)
Witnessed Jillian Early, Chief Operating Officer 2mg  of IV morphine down the sink.   Lum Babe, RN

## 2014-01-08 NOTE — Progress Notes (Signed)
I spoke with pt and his daughter sharing my perspective on the recent dx of metastatic melanoma I concur with Palliative Care involvement and have spoken with them It is probably worthwhile to have Onc see him @ some point prior to discharge to discuss treatment options too  PCCM will sign off. Please call if we can be of further assistance  Merton Border, MD ; [facility] [phone].  After 5:30 PM or weekends, call [phone]

## 2014-01-08 NOTE — Progress Notes (Signed)
Physical Therapy Treatment Patient Details Name: Aaron [name] MRN: [id number] DOB: 1930-01-11 Today's Date: 01/08/2014    History of Present Illness Aaron [name] is a 80 y.o. male past medical history of cigar use, chronic diastolic heart failure and moderate aortic stenosis, also with melanoma in the right foot status post excision 3 years ago that comes in for sudden onset of shortness of breath that started the day prior to admission. Pt with pleural effusion s/p L thoracentesis. S/p VATS procedure for biopsy 12/17.    PT Comments    Pt progressing towards physical therapy goals. Limited by fatigued today, pt able to tolerate transfer training with min assist +2. Performed therapeutic exercises. SpO2 down to 88% on 3L supplemental O2 after transfer, back to >91% with 4L and education for pursed lip breathing. Will continue to follow and work with patient progressing functional independence. May require short term rehab if pt does not progress quickly enough prior to d/c. Patient will continue to benefit from skilled physical therapy services to further improve independence with functional mobility.   Follow Up Recommendations  Home health PT     Equipment Recommendations   (TBD)    Recommendations for Other Services OT consult     Precautions / Restrictions Precautions Precautions: Fall Precaution Comments: Chest tube Lt side Restrictions Weight Bearing Restrictions: No    Mobility  Bed Mobility Overal bed mobility: Needs Assistance;+ 2 for safety/equipment Bed Mobility: Supine to Sit     Supine to sit: Min assist;HOB elevated;+2 for safety/equipment     General bed mobility comments: Min assist for truncal support to seated position. Allowed pt to pull self with PT help using Rt UE. Requires extra time to scoot forwards to edge of bed.  Transfers Overall transfer level: Needs assistance Equipment used: Rolling walker (2 wheeled) Transfers: Sit to/from [employer] Sit to Stand: Min assist;+2 safety/equipment Stand pivot transfers: Min assist       General transfer comment: Min assist for boost to stand from lowest bed setting. Second person available for equipment management and safety. Min assist for walker placement with pivot transfer to chair. Good control with descent into seat. VC for technique and sequencing throughout.  Ambulation/Gait                 Stairs            Wheelchair Mobility    Modified Rankin (Stroke Patients Only)       Balance           Standing balance support: Bilateral upper extremity supported Standing balance-Leahy Scale: Poor                      Cognition Arousal/Alertness: Awake/alert Behavior During Therapy: WFL for tasks assessed/performed Overall Cognitive Status: Within Functional Limits for tasks assessed                      Exercises General Exercises - Lower Extremity Ankle Circles/Pumps: AROM;Both;20 reps;Supine;Seated Gluteal Sets: Strengthening;Both;10 reps;Seated Long Arc Quad: Strengthening;Both;10 reps;Seated Hip Flexion/Marching: Strengthening;Both;10 reps;Seated    General Comments General comments (skin integrity, edema, etc.): Educated pt on pursed lip breathing technique throughout therapy session. SpO2 dropped down to 88% on 3L supplemental O2 after transfer. Required 4L to return >91%. RN notified.      Pertinent Vitals/Pain Pain Assessment: No/denies pain    Home Living  Prior Function            PT Goals (current goals can now be found in the care plan section) Acute Rehab PT Goals Patient Stated Goal: to go home PT Goal Formulation: With patient/family Time For Goal Achievement: 01/19/14 Potential to Achieve Goals: Good Progress towards PT goals: Progressing toward goals    Frequency  Min 3X/week    PT Plan Current plan remains appropriate    Co-evaluation             End  of Session Equipment Utilized During Treatment: Gait belt;Oxygen Activity Tolerance: Patient limited by fatigue Patient left: in chair;with call bell/phone within reach;with nursing/sitter in room     Time: 1354-1418 PT Time Calculation (min) (ACUTE ONLY): 24 min  Charges:  $Therapeutic Exercise: 8-22 mins $Therapeutic Activity: 8-22 mins                    G Codes:      Ellouise Newer 01/31/14, 3:03 PM  Camille Bal [place], [place]

## 2014-01-08 NOTE — Progress Notes (Signed)
[facility] PROGRESS NOTE  Interval History: 80 y.o. male past medical history of cigar use, chronic diastolic heart failure and moderate aortic stenosis, with PFTs done about 2 years ago which were within normal limits, followed by Dr. Shyrl [name] relates his symptoms at that time were from his obesity, also with melanoma in the right foot status post excision 3 years ago. CT showed pleural effusion. Consult IR for thoracocentesis which showed bloody fluids. Consult CT surgery which proceded with with VATS and showed multiple nodule in lung heart and pleura. Pleurex cath placed.  Assessment/Plan: Acute respiratory failure with hypoxia due to  Malignant Pleural effusion, left: - Ct angio of chest as below. - S/p US guided thoracocentesis.  - consulted cardiothoracic surgery who recommended VATS with  Biopsy and pleurex cath. extensive tumor involving visceral and parietal pleura and pericardial fat pad appeared to be encasement and/or replacement of left lower lobe with tumor. - consult oncology and PMT. - poor prognosis.  Leukocytosis: - worsen, most liely due to stress demargination.  Hyperkalemia: - Give kayexalate. - Repeat b-met ina m.  Swelling of lower extremity - doppler negative for DVT. - ct abdo men and pelvis rule vascular obstruction.  Chronic diastolic heart failure - Hold lasix.  - place ted hose.  H/O Malignant melanoma of right foot     Code Status: full Family Communication: none  Disposition Plan: inpatient   Consultants:  IR  PCCM  Cardiothoracic surgery  Procedures:  CXR 12.13.2015: Smaller left pleural effusion. No pneumothorax. Ct angio 12.12.2015; no PE, pleural effusion, 2.4 cm well-defined soft tissue mass in the right lower lobe  adjacent to the segmental bronchovascular structures  Antibiotics:  None  HPI/Subjective: Sleeping  Objective: Filed Vitals:   01/08/14 0200 01/08/14 0404 01/08/14 0409 01/08/14 0411  BP:     144/67  Pulse: 85   98  Temp:   98.3 F (36.8 C)   TempSrc:   Oral   Resp: 12   14  Height:      Weight:  131.9 kg (290 lb 12.6 oz)    SpO2: 90%   90%    Intake/Output Summary (Last 24 hours) at 01/08/14 0725 Last data filed at 01/08/14 0443  Gross per 24 hour  Intake   1260 ml  Output   1450 ml  Net   -190 ml   Filed Weights   01/12/2014 0438 12/30/2013 1536 01/08/14 0404  Weight: 129.956 kg (286 lb 8 oz) 132.9 kg (292 lb 15.9 oz) 131.9 kg (290 lb 12.6 oz)    Exam:  General: Alert, awake, oriented x3, in no acute distress.  HEENT: No bruits, no goiter.  Heart: Regular rate and rhythm. Lungs: Good air movement.with pleurex cath in placed. Abdomen: Soft, nontender, nondistended, positive bowel sounds.    Data Reviewed: Basic Metabolic Panel:  Recent Labs Lab 12/31/2013 0930 12/30/2013 1635 01/03/14 0431 01/04/14 0525 01/05/14 0313 01/08/14 0318  NA 134*  --  135* 132* 137 135*  K 4.5  --  4.5 3.6* 3.4* 5.9*  CL 97  --  99 95* 99 101  CO2 19  --  _0 GLUCOSE 112*  --  108* 116* 109* 140*  BUN 20  --  _1 CREATININE 0.90 0.94 0.90 1.10 1.01 1.34  CALCIUM 8.4  --  8.4 8.3* 8.3* 8.2*   Liver Function Tests:  Recent Labs Lab 01/06/2014 0930 01/03/14 1200  AST 18 21  ALT 16 17  ALKPHOS 69 68  BILITOT 0.5 0.3  PROT 6.8 6.5  ALBUMIN 3.2* 3.2*   No results for input(s): LIPASE, AMYLASE in the last 168 hours. No results for input(s): AMMONIA in the last 168 hours. CBC:  Recent Labs Lab 12/30/2013 0930 01/18/2014 1635 01/03/14 0431 01/04/14 1140 01/08/14 0318  WBC 14.3* 14.7* 12.2* 11.9* 20.0*  NEUTROABS 10.5*  --   --   --   --   HGB 9.8* 9.3* 8.7* 8.0* 7.5*  HCT 29.0* 28.0* 26.3* 23.7* 24.0*  MCV 94.8 95.2 96.0 97.9 98.8  PLT 239 246 239 234 327   Cardiac Enzymes:  Recent Labs Lab 12/22/2013 1635 01/12/2014 2130 01/03/14 0431  TROPONINI <0.30 <0.30 <0.30   BNP (last 3 results)  Recent Labs  12/24/2013 0930  PROBNP 340.4    CBG:  Recent Labs Lab 01/04/14 0817 01/04/14 1120 01/17/2014 1832 01/20/2014 2357 01/08/14 0553  GLUCAP 122* 125* 119* 142* 144*    Recent Results (from the past 240 hour(s))  Culture, blood (routine x 2)     Status: None (Preliminary result)   Collection Time: 01/16/2014  4:20 PM  Result Value Ref Range Status   Specimen Description BLOOD LEFT ARM  Final   Special Requests BOTTLES DRAWN AEROBIC AND ANAEROBIC 10 CC  Final   Culture  Setup Time   Final    12/25/2013 22:46 Performed at [facility]    Culture   Final           BLOOD CULTURE RECEIVED NO GROWTH TO DATE CULTURE WILL BE HELD FOR 5 DAYS BEFORE ISSUING A FINAL NEGATIVE REPORT Performed at [facility]    Report Status PENDING  Incomplete  Culture, blood (routine x 2)     Status: None (Preliminary result)   Collection Time: 12/22/2013  4:30 PM  Result Value Ref Range Status   Specimen Description BLOOD RIGHT HAND  Final   Special Requests BOTTLES DRAWN AEROBIC ONLY 5 ML  Final   Culture  Setup Time   Final    12/26/2013 22:46 Performed at [facility]    Culture   Final           BLOOD CULTURE RECEIVED NO GROWTH TO DATE CULTURE WILL BE HELD FOR 5 DAYS BEFORE ISSUING A FINAL NEGATIVE REPORT Performed at [facility]    Report Status PENDING  Incomplete  Body fluid culture     Status: None   Collection Time: 01/03/14  9:33 AM  Result Value Ref Range Status   Specimen Description PLEURAL LEFT FLUID  Final   Special Requests NONE  Final   Gram Stain   Final    MODERATE WBC PRESENT,BOTH PMN AND MONONUCLEAR NO SQUAMOUS EPITHELIAL CELLS SEEN NO ORGANISMS SEEN Performed at [facility]    Culture   Final    NO GROWTH 3 DAYS Performed at [facility]    Report Status 12/29/2013 FINAL  Final  Body fluid culture     Status: None (Preliminary result)   Collection Time: 12/29/2013 10:36 AM  Result Value Ref Range Status   Specimen Description FLUID PLEURAL LEFT  Final    Special Requests NONE  Final   Gram Stain   Final    FEW WBC PRESENT, PREDOMINANTLY MONONUCLEAR NO ORGANISMS SEEN Performed at [facility]    Culture PENDING  Incomplete   Report Status PENDING  Incomplete     Studies: Dg Chest Port 1 View  12/28/2013   CLINICAL  DATA:  Shortness of breath.  Malignant pleural effusion.  EXAM: PORTABLE CHEST - 1 VIEW  COMPARISON:  01/04/2014  FINDINGS: Two left-sided chest tubes have been inserted. The left pleural effusion has almost completely resolved. Slight atelectasis at the left lung base.  Pacemaker in place. Left central venous catheter tip is probably in the left innominate vein to the left of midline.  There is no pneumothorax. Right lung is clear. Heart size and vascularity are normal.  IMPRESSION: Almost complete evacuation of the left pleural effusion. Slight atelectasis at the left lung base. Left central venous catheter tip is in the region of the left innominate vein to the left of midline.   Electronically Signed   By: Rozetta Nunnery M.D.   On: 01/21/2014 13:08    Scheduled Meds: . albuterol  2.5 mg Nebulization QID  . allopurinol  200 mg Oral Daily  . amLODipine  5 mg Oral Daily  . antiseptic oral rinse  7 mL Mouth Rinse BID  . aspirin EC  81 mg Oral Daily  . bisacodyl  10 mg Oral Daily  . carvedilol  6.25 mg Oral BID  . enoxaparin (LOVENOX) injection  40 mg Subcutaneous Daily  . HYDROcodone-acetaminophen  1 tablet Oral 3 times per day  . insulin aspart  0-24 Units Subcutaneous 4 times per day  . mometasone-formoterol  2 puff Inhalation BID  . morphine   Intravenous 6 times per day  . senna-docusate  1 tablet Oral QHS   Continuous Infusions: . dextrose 5 % and 0.45 % NaCl with KCl 20 mEq/L 100 mL/hr at 01/08/14 [name], Aaron Moncrief  [facility] Pager [phone]. If 8PM-8AM, please contact night-coverage at [email or web address], password [facility] 01/08/2014, 7:25 AM  LOS: 6 days

## 2014-01-08 NOTE — Op Note (Signed)
NAMEHARDIN, Aaron [name] NO.:  [id number]  MEDICAL RECORD NO.:  [id number]  LOCATION:  3S09C                        FACILITY:  [facility]  PHYSICIAN:  Revonda Standard. Roxan Hockey, M.D.DATE OF BIRTH:  [birth year]  DATE OF PROCEDURE:  12/31/2013 DATE OF DISCHARGE:                              OPERATIVE REPORT   PREOPERATIVE DIAGNOSIS:  Left pleural effusion.  POSTOPERATIVE DIAGNOSIS:  Malignant left pleural effusion secondary to metastatic melanoma.  PROCEDURE:  Left video-assisted thoracoscopy with drainage of pleural Effusion. Biopsies of the lung, parietal pleura and pericardial fat pad. Insertion of pleural drainage catheter.  SURGEON:  Revonda Standard. Roxan Hockey, M.D.  ASSISTANT:  None.  ANESTHESIA:  General.  FINDINGS:  3 L of bloody fluid evacuated, extensive implants involving visceral and parietal pleura and pericardial fat pad. There appeared to be encasement and/or replacement of left lower lobe with tumor. Frozen consistent with metastatic melanoma.  CLINICAL NOTE:  Aaron [name] is an 80 year old gentleman with a history of melanoma who presented with a chief complaint of shortness of breath. He was found to have a large left pleural effusion.  He had thoracentesis, 1.3 L was drained.  This improved his symptoms temporarily.  Cytology was negative.  The patient had a rapid recurrence of his symptoms and was referred for thoracoscopic drainage.  The indications, risks, benefits, and alternatives were discussed in detail with the patient.  We discussed drainage of the effusion, possible decortication, and possible pleural catheter placement.  He understood and accepted the risks of surgery and agreed to proceed.  OPERATIVE NOTE:  Aaron [name] was brought to the preoperative holding area on January 07, 2014.  There, Anesthesia placed a central line and arterial blood pressure monitoring line.  He was taken to the operating room, anesthetized, and intubated.  A Foley  catheter was placed. Sequential compressive devices were placed on the calves for DVT prophylaxis.  1.5 g of Zinacef was administered for antibiotic prophylaxis.  He was placed in a right lateral decubitus position and the left chest was prepped and draped in usual sterile fashion.  Single lung ventilation of the right lung was initiated and was tolerated well throughout the procedure.  An incision was made in the midaxillary line in the 7th interspace space on the left chest, was carried through the skin and subcutaneous tissue. The chest was bluntly entered using a hemostat.  A sucker was placed in the chest and 3 L of bloody fluid was evacuated.  A port was inserted and a 5-mm thoracoscope was advanced in the chest.  There was a small amount of residual bloody fluid noted.  There were multiple implants noted.  These were purple to black in color.  There were extensively involvement of the visceral and parietal pleura and the pericardial fat pad. The lingula had multiple implants and the basilar segments of the lower lobe appeared to be essentially completely replaced and/or encased by this disease.  These implants were very friable and bled easily. Biopsies were taken from the parietal pleural and pericardial fat pad implants. Attempting to retract the lung resulted in a tear of 1 of the lung lesions which was sent as a biopsy for pathology  as well.  Pathology subsequently returned showing probable metastatic melanoma.  A PleurX catheter then was tunneled out through the skin and placed through a separate incision into the chest.  This was directed posterolaterally and was secured to skin with 3-0 nylon suture.  The remaining incision was closed with a 3-0 Vicryl subcuticular suture. Two 32-French Blake drains were placed, 1 posteriorly and 1 laterally. These were secured with #1 silk sutures.  The left lung was reinflated with thoracoscopic visualization and there was good expansion  of the lung with some limitation of re-expansion in the basilar segments of the left lower lobe.  The utility incision then was closed with a running #1 Vicryl fascial suture followed by a 2-0 Vicryl subcutaneous suture and a 3-0 Vicryl subcuticular suture.  The PleurX catheter and Blake drains were placed to Pleur-Evac drainage.  The patient was extubated in the operating room and taken to the postanesthetic care unit in good condition.     Revonda Standard Roxan Hockey, M.D.     SCH/MEDQ  D:  12/28/2013  T:  01/08/2014  Job:  [id number]

## 2014-01-08 NOTE — Progress Notes (Addendum)
[address]       [place] [address]             [phone]          1 Day Post-Op Procedure(s) (LRB): Left  VIDEO ASSISTED THORACOSCOPY (VATS) with multiple biopsies(lung,pleural and pericardial fat pad bx's) (Left) DRAINAGE OF PLEURAL EFFUSION (Left) INSERTION PLEURAL DRAINAGE CATHETER (Left)  Subjective: Having some pain with coughing, but PCA helps. No appetite.   Objective: Vital signs in last 24 hours: Patient Vitals for the past 24 hrs:  BP Temp Temp src Pulse Resp SpO2 Height Weight  01/08/14 0850 - - - - - 92 % - -  01/08/14 0700 - 98.5 F (36.9 C) Oral - - - - -  01/08/14 0411 (!) 144/67 mmHg - - 98 14 90 % - -  01/08/14 0409 - 98.3 F (36.8 C) Oral - - - - -  01/08/14 0404 - - - - - - - 290 lb 12.6 oz (131.9 kg)  01/08/14 0200 - - - 85 12 90 % - -  01/08/14 0003 - 98.5 F (36.9 C) - - - - - -  01/15/2014 2357 - - - - 11 90 % - -  12/29/2013 2356 (!) 143/63 mmHg - - 96 16 90 % - -  01/13/2014 2246 - - - 96 14 (!) 89 % - -  12/24/2013 2200 - - - 91 11 92 % - -  01/09/2014 2014 - - - - - 91 % - -  12/22/2013 1957 131/72 mmHg - - 98 18 94 % - -  12/30/2013 1954 - 99.1 F (37.3 C) Oral - - - - -  01/12/2014 1953 - - - - 16 91 % - -  01/17/2014 1807 - - - - - 94 % - -  12/31/2013 1550 - - - 74 - 96 % - -  01/01/2014 1545 - - - 75 15 97 % - -  01/17/2014 1536 (!) 138/59 mmHg 99.6 F (37.6 C) Oral 72 17 96 % 6\' 2"  (1.88 m) 292 lb 15.9 oz (132.9 kg)  01/01/2014 1530 - 97.9 F (36.6 C) - 73 15 97 % - -  12/23/2013 1521 138/60 mmHg - - 73 15 95 % - -  01/20/2014 1515 - - - 69 18 97 % - -  12/22/2013 1506 (!) 145/61 mmHg - - 72 15 97 % - -  12/31/2013 1500 - - - 71 15 98 % - -  12/31/2013 1452 132/66 mmHg - - 70 10 98 % - -  01/06/2014 1445 - - - 71 15 98 % - -  01/05/2014 1437 (!) 149/53 mmHg - - 73 19 100 % - -  01/20/2014 1430 - - - 69 18 96 % - -  01/06/2014 1422 (!) 134/57 mmHg - - 67 17 94 % - -  01/15/2014 1415 - - - 68 12 99 % - -  01/01/2014 1406 138/70 mmHg - - 69 15 99  % - -  12/24/2013 1400 - - - 65 11 97 % - -  01/06/2014 1351 131/90 mmHg - - 60 11 96 % - -  01/08/2014 1345 - - - 72 16 93 % - -  01/21/2014 1336 134/67 mmHg 97.2 F (36.2 C) - 69 11 96 % - -  01/05/2014 1330 - - - 66 12 98 % - -  01/14/2014 1321 131/89 mmHg - - 65 11 96 % - -  01/19/2014 1315 - - - 65 11 97 % - -  01/21/2014 1307 (!) 144/69 mmHg - - 67 13 95 % - -  01/13/2014 1300 - - - 66 14 99 % - -  01/13/2014 1251 (!) 152/68 mmHg - - 64 10 (!) 76 % - -  01/18/2014 1245 - - - 65 13 95 % - -  01/18/2014 1237 140/66 mmHg - - 67 12 97 % - -  12/24/2013 1235 - - - 65 11 97 % - -  12/29/2013 1230 - - - 65 14 96 % - -  01/21/2014 1221 (!) 147/77 mmHg - - 66 13 96 % - -  01/19/2014 1215 - - - 64 13 94 % - -  01/06/2014 1212 - - - - 13 95 % - -   Current Weight  01/08/14 290 lb 12.6 oz (131.9 kg)   CBGs 142-140-144  Intake/Output from previous day: 12/17 0701 - 12/18 0700 In: 1260 [P.O.:360; I.V.:900] Out: 1550 [Urine:700; Blood:100; Chest Tube:750]    PHYSICAL EXAM:  Heart: RRR Lungs: Diminished BS on L Wound: Dressed and dry Chest tube: No air leak    Lab Results: CBC: Recent Labs  01/08/14 0318  WBC 20.0*  HGB 7.5*  HCT 24.0*  PLT 327   BMET:  Recent Labs  01/08/14 0318  NA 135*  K 5.9*  CL 101  CO2 23  GLUCOSE 140*  BUN 18  CREATININE 1.34  CALCIUM 8.2*    PT/INR: No results for input(s): LABPROT, INR in the last 72 hours.  CXR: FINDINGS: Grossly unchanged enlarged cardiac silhouette and mediastinal contours. Minimal advancement of left jugular central venous catheter with tip overlying the midline of the mediastinum. Otherwise, stable position of support apparatus. No pneumothorax. Lung volumes remain reduced with grossly unchanged bibasilar heterogeneous opacities, left greater than right. Known nodule within the medial basilar segment of the right lung is unchanged. No new focal airspace opacities. Pulmonary vasculature remains indistinct with cephalization of flow.  Unchanged trace bilateral effusions, left greater than right. Unchanged bones.  IMPRESSION: 1. Similar findings of near complete evacuation of left-sided pleural effusion post surgical drainage. 2. Stable positioning of support apparatus. No pneumothorax. Note, tip of left jugular approach central venous catheter overlies the midline of the mediastinum and osseus an indeterminate location. Correlation for venous waveform and/or ABG is recommended to ensure appropriate venous location. 3. Similar findings of hypoventilation and bibasilar opacities, left greater than right, atelectasis versus infiltrate/ malignancy. Critical Value/emergent results were called by telephone at the time of interpretation on 01/08/2014 at 8:07 am to [place], [place] who verbally acknowledged these results.   Assessment/Plan: S/P Procedure(s) (LRB): Left  VIDEO ASSISTED THORACOSCOPY (VATS) with multiple biopsies(lung,pleural and pericardial fat pad bx's) (Left) DRAINAGE OF PLEURAL EFFUSION (Left) INSERTION PLEURAL DRAINAGE CATHETER (Left) CT output high, so will leave both CTs for now.  Will place to water seal. Leukocytosis, no fever.  Likely atelectasis.Will watch. Leave Foley one more day for volume measurement. D/c A-line. Continue pulm toilet/IS.   LOS: 6 days    COLLINS,GINA H 01/08/2014  Patient seen and examined, agree with above

## 2014-01-09 ENCOUNTER — Inpatient Hospital Stay (HOSPITAL_COMMUNITY): Payer: Medicare Other

## 2014-01-09 LAB — CBC
HEMATOCRIT: 20.8 % — AB (ref 39.0–52.0)
HEMATOCRIT: 24.7 % — AB (ref 39.0–52.0)
Hemoglobin: 6.7 g/dL — CL (ref 13.0–17.0)
Hemoglobin: 8.1 g/dL — ABNORMAL LOW (ref 13.0–17.0)
MCH: 31.5 pg (ref 26.0–34.0)
MCH: 31.4 pg (ref 26.0–34.0)
MCHC: 32.2 g/dL (ref 30.0–36.0)
MCHC: 32.8 g/dL (ref 30.0–36.0)
MCV: 97.7 fL (ref 78.0–100.0)
MCV: 95.7 fL (ref 78.0–100.0)
PLATELETS: 315 10*3/uL (ref 150–400)
PLATELETS: 333 10*3/uL (ref 150–400)
RBC: 2.13 MIL/uL — ABNORMAL LOW (ref 4.22–5.81)
RBC: 2.58 MIL/uL — AB (ref 4.22–5.81)
RDW: 14.7 % (ref 11.5–15.5)
RDW: 15.6 % — ABNORMAL HIGH (ref 11.5–15.5)
WBC: 20.7 10*3/uL — ABNORMAL HIGH (ref 4.0–10.5)
WBC: 27.9 10*3/uL — ABNORMAL HIGH (ref 4.0–10.5)

## 2014-01-09 LAB — COMPREHENSIVE METABOLIC PANEL
ALBUMIN: 2.6 g/dL — AB (ref 3.5–5.2)
ALT: 18 U/L (ref 0–53)
ANION GAP: 11 (ref 5–15)
AST: 17 U/L (ref 0–37)
Alkaline Phosphatase: 69 U/L (ref 39–117)
BILIRUBIN TOTAL: 0.4 mg/dL (ref 0.3–1.2)
BUN: 24 mg/dL — AB (ref 6–23)
CALCIUM: 8.3 mg/dL — AB (ref 8.4–10.5)
CO2: 24 mEq/L (ref 19–32)
CREATININE: 1.24 mg/dL (ref 0.50–1.35)
Chloride: 101 mEq/L (ref 96–112)
GFR calc Af Amer: 61 mL/min — ABNORMAL LOW (ref >90)
GFR calc non Af Amer: 52 mL/min — ABNORMAL LOW (ref >90)
Glucose, Bld: 131 mg/dL — ABNORMAL HIGH (ref 70–99)
Potassium: 5.1 mEq/L (ref 3.7–5.3)
Sodium: 136 mEq/L — ABNORMAL LOW (ref 137–147)
Total Protein: 6.3 g/dL (ref 6.0–8.3)

## 2014-01-09 LAB — GLUCOSE, CAPILLARY
GLUCOSE-CAPILLARY: 132 mg/dL — AB (ref 70–99)
Glucose-Capillary: 191 mg/dL — ABNORMAL HIGH (ref 70–99)

## 2014-01-09 LAB — ABO/RH: ABO/RH(D): O NEG

## 2014-01-09 LAB — PREPARE RBC (CROSSMATCH)

## 2014-01-09 MED ORDER — PROMETHAZINE HCL 25 MG PO TABS: 12.5000 mg | ORAL_TABLET | Freq: Four times a day (QID) | ORAL | Status: DC | PRN

## 2014-01-09 MED ORDER — DM-GUAIFENESIN ER 30-600 MG PO TB12
1.0000 | ORAL_TABLET | Freq: Two times a day (BID) | ORAL | Status: DC
  Administered 2014-01-09: 1 via ORAL
  Filled 2014-01-09 (×4): qty 1

## 2014-01-09 MED ORDER — FUROSEMIDE 10 MG/ML IJ SOLN
20.0000 mg | Freq: Once | INTRAMUSCULAR | Status: AC
  Administered 2014-01-09: 20 mg via INTRAVENOUS

## 2014-01-09 MED ORDER — FLEET ENEMA 7-19 GM/118ML RE ENEM
1.0000 | ENEMA | Freq: Once | RECTAL | Status: AC
  Administered 2014-01-09: 1 via RECTAL
  Filled 2014-01-09: qty 1

## 2014-01-09 MED ORDER — METOCLOPRAMIDE HCL 5 MG/ML IJ SOLN
10.0000 mg | Freq: Four times a day (QID) | INTRAMUSCULAR | Status: DC
  Administered 2014-01-09 – 2014-01-10 (×4): 10 mg via INTRAVENOUS
  Filled 2014-01-09 (×8): qty 2

## 2014-01-09 MED ORDER — SODIUM CHLORIDE 0.9 % IV SOLN
1.0000 mg/h | INTRAVENOUS | Status: DC
  Administered 2014-01-09: 1 mg/h via INTRAVENOUS
  Administered 2014-01-10: 6 mg/h via INTRAVENOUS
  Administered 2014-01-10: 5 mg/h via INTRAVENOUS
  Filled 2014-01-09: qty 10

## 2014-01-09 MED ORDER — PROMETHAZINE HCL 25 MG/ML IJ SOLN
12.5000 mg | Freq: Four times a day (QID) | INTRAMUSCULAR | Status: DC | PRN
  Administered 2014-01-09 – 2014-01-10 (×2): 12.5 mg via INTRAVENOUS
  Filled 2014-01-09 (×2): qty 1

## 2014-01-09 MED ORDER — BISACODYL 5 MG PO TBEC: 5.0000 mg | DELAYED_RELEASE_TABLET | Freq: Two times a day (BID) | ORAL | Status: DC

## 2014-01-09 MED ORDER — POLYETHYLENE GLYCOL 3350 17 G PO PACK
17.0000 g | PACK | Freq: Two times a day (BID) | ORAL | Status: DC
  Administered 2014-01-09: 17 g via ORAL
  Filled 2014-01-09 (×2): qty 1

## 2014-01-09 MED ORDER — FUROSEMIDE 10 MG/ML IJ SOLN
INTRAMUSCULAR | Status: AC
  Administered 2014-01-09: 20 mg via INTRAVENOUS
  Filled 2014-01-09: qty 4

## 2014-01-09 MED ORDER — BISACODYL 10 MG RE SUPP
10.0000 mg | Freq: Once | RECTAL | Status: AC
  Administered 2014-01-09: 10 mg via RECTAL
  Filled 2014-01-09: qty 1

## 2014-01-09 MED ORDER — SODIUM CHLORIDE 0.9 % IV SOLN
Freq: Once | INTRAVENOUS | Status: AC
  Administered 2014-01-09: 22:00:00 via INTRAVENOUS

## 2014-01-09 NOTE — Progress Notes (Signed)
Pt did not want to do his Flutter at this time. Pt is feeling nauseous and would like to wait. Pt daughter is in room too and Flutter was explained to both. RT will continue to monitor and try Flutter at a later time.

## 2014-01-09 NOTE — Progress Notes (Signed)
Wasted 16 mL (16 mg) Morphine in sink. Witnessed by [employer].

## 2014-01-09 NOTE — Progress Notes (Addendum)
[facility] PROGRESS NOTE  Interval History: 80 y.o. male past medical history of cigar use, chronic diastolic heart failure and moderate aortic stenosis, with PFTs done about 2 years ago which were within normal limits, followed by Dr. Shyrl Numbers relates his symptoms at that time were from his obesity, also with melanoma in the right foot status post excision 3 years ago. CT showed pleural effusion. Consult IR for thoracocentesis which showed bloody fluids with atypical cells. Consult CT surgery which proceded with with VATS and showed multiple nodule in lung heart and pleura. Chest tube inserted, surgical pathology showing metastatic melanoma.  Assessment/Plan: Acute respiratory failure with hypoxia due to  Malignant Pleural effusion, left: - Ct angio of chest as below. - S/p US guided thoracocentesis.  - consulted cardiothoracic surgery who recommended VATS with  Biopsy and chest tube. extensive tumor involving visceral and parietal pleura and pericardial fat pad appeared to be encasement and/or replacement of left lower lobe with tumor. -Surgical pathology showing metastatic malignant melanoma - poor prognosis. - Chest tube still draining a large amount of bloody fluid, management as per CT surgery.  Metastatic malignant melanoma -Oncology consult appreciated(will check with pathology lab on Monday if he received a request for BRAF mutation)  Leukocytosis: - Continue to monitor, most likely stress related, nonfebrile, would hold on starting antibiotic, will check pro-calcitonin.  Hyperkalemia: - Monitor  Anemia: - patient is receiving 2 units packed red blood cell transfusion 12/19, recheck labs in a.m.  Swelling of lower extremity - doppler negative for DVT. - ct abdo men and pelvis showing tumor involving right iliac vessels.  Chronic diastolic heart failure - Hold lasix.  - place ted hose.  H/O Malignant melanoma of right foot     Code Status: full Family  Communication: none  Disposition Plan: inpatient   Consultants:  IR  PCCM  Cardiothoracic surgery  Oncology  Procedures:  CXR 12.13.2015: Smaller left pleural effusion. No pneumothorax. Ct angio 12.12.2015; no PE, pleural effusion, 2.4 cm well-defined soft tissue mass in the right lower lobe  adjacent to the segmental bronchovascular structures  2 units back to her blood cell transfusion 12/19.  Antibiotics:  None  HPI/Subjective: Sleeping  Objective: Filed Vitals:   01/09/14 0826 01/09/14 0915 01/09/14 1052 01/09/14 1118  BP:  133/62 146/69 150/66  Pulse: 88 94 95 85  Temp:  97.7 F (36.5 C) 98.1 F (36.7 C) 98.6 F (37 C)  TempSrc:  Oral Oral Oral  Resp: 12 17 15 17   Height:      Weight:      SpO2: 98% 99% 96% 94%    Intake/Output Summary (Last 24 hours) at 01/09/14 1239 Last data filed at 01/09/14 1135  Gross per 24 hour  Intake   1945 ml  Output   1430 ml  Net    515 ml   Filed Weights   12/27/2013 1536 01/08/14 0404 01/09/14 0343  Weight: 132.9 kg (292 lb 15.9 oz) 131.9 kg (290 lb 12.6 oz) 132.2 kg (291 lb 7.2 oz)    Exam:  General: Alert, awake, oriented x3, in no acute distress.  HEENT: No bruits, no goiter.  Heart: Regular rate and rhythm. Lungs: Good air movement.with pleurex cath in placed. Abdomen: Soft, nontender, nondistended, positive bowel sounds.    Data Reviewed: Basic Metabolic Panel:  Recent Labs Lab 01/03/14 0431 01/04/14 0525 01/05/14 0313 01/08/14 0318 01/09/14 0320  NA 135* 132* 137 135* 136*  K 4.5 3.6* 3.4* 5.9* 5.1  CL 99  95* 99 101 101  CO2 22 26 25 23 24   GLUCOSE 108* 116* 109* 140* 131*  BUN 21 18 16 18  24*  CREATININE 0.90 1.10 1.01 1.34 1.24  CALCIUM 8.4 8.3* 8.3* 8.2* 8.3*   Liver Function Tests:  Recent Labs Lab 01/03/14 1200 01/09/14 0320  AST 21 17  ALT 17 18  ALKPHOS 68 69  BILITOT 0.3 0.4  PROT 6.5 6.3  ALBUMIN 3.2* 2.6*   No results for input(s): LIPASE, AMYLASE in the last 168  hours. No results for input(s): AMMONIA in the last 168 hours. CBC:  Recent Labs Lab 12/22/2013 1635 01/03/14 0431 01/04/14 1140 01/08/14 0318 01/09/14 0320  WBC 14.7* 12.2* 11.9* 20.0* 20.7*  HGB 9.3* 8.7* 8.0* 7.5* 6.7*  HCT 28.0* 26.3* 23.7* 24.0* 20.8*  MCV 95.2 96.0 97.9 98.8 97.7  PLT 246 239 234 327 315   Cardiac Enzymes:  Recent Labs Lab 01/08/2014 1635 01/01/2014 2130 01/03/14 0431  TROPONINI <0.30 <0.30 <0.30   BNP (last 3 results)  Recent Labs  01/15/2014 0930  PROBNP 340.4   CBG:  Recent Labs Lab 01/08/14 0553 01/08/14 1223 01/08/14 1830 01/08/14 2355 01/09/14 0727  GLUCAP 144* 133* 136* 132* 191*    Recent Results (from the past 240 hour(s))  Culture, blood (routine x 2)     Status: None   Collection Time: 01/17/2014  4:20 PM  Result Value Ref Range Status   Specimen Description BLOOD LEFT ARM  Final   Special Requests BOTTLES DRAWN AEROBIC AND ANAEROBIC 10 CC  Final   Culture  Setup Time   Final    01/15/2014 22:46 Performed at [facility]    Culture   Final    NO GROWTH 5 DAYS Performed at [facility]    Report Status 01/08/2014 FINAL  Final  Culture, blood (routine x 2)     Status: None   Collection Time: 01/03/2014  4:30 PM  Result Value Ref Range Status   Specimen Description BLOOD RIGHT HAND  Final   Special Requests BOTTLES DRAWN AEROBIC ONLY 5 ML  Final   Culture  Setup Time   Final    01/01/2014 22:46 Performed at [facility]    Culture   Final    NO GROWTH 5 DAYS Performed at [facility]    Report Status 01/08/2014 FINAL  Final  Body fluid culture     Status: None   Collection Time: 01/03/14  9:33 AM  Result Value Ref Range Status   Specimen Description PLEURAL LEFT FLUID  Final   Special Requests NONE  Final   Gram Stain   Final    MODERATE WBC PRESENT,BOTH PMN AND MONONUCLEAR NO SQUAMOUS EPITHELIAL CELLS SEEN NO ORGANISMS SEEN Performed at [facility]    Culture   Final     NO GROWTH 3 DAYS Performed at [facility]    Report Status 12/24/2013 FINAL  Final  Fungus Culture with Smear     Status: None (Preliminary result)   Collection Time: 12/26/2013 10:36 AM  Result Value Ref Range Status   Specimen Description FLUID PLEURAL LEFT  Final   Special Requests NONE  Final   Fungal Smear   Final    NO YEAST OR FUNGAL ELEMENTS SEEN Performed at [facility]    Culture   Final    CULTURE IN PROGRESS FOR FOUR WEEKS Performed at [facility]    Report Status PENDING  Incomplete  Body fluid  culture     Status: None (Preliminary result)   Collection Time: 01/10/2014 10:36 AM  Result Value Ref Range Status   Specimen Description FLUID PLEURAL LEFT  Final   Special Requests NONE  Final   Gram Stain   Final    FEW WBC PRESENT, PREDOMINANTLY MONONUCLEAR NO ORGANISMS SEEN Performed at [facility]    Culture NO GROWTH Performed at [facility]   Final   Report Status PENDING  Incomplete  AFB culture with smear     Status: None (Preliminary result)   Collection Time: 12/26/2013 10:36 AM  Result Value Ref Range Status   Specimen Description FLUID PLEURAL LEFT  Final   Special Requests NONE  Final   Acid Fast Smear   Final    NO ACID FAST BACILLI SEEN Performed at [facility]    Culture   Final    CULTURE WILL BE EXAMINED FOR 6 WEEKS BEFORE ISSUING A FINAL REPORT Performed at [facility]    Report Status PENDING  Incomplete     Studies: Ct Abdomen Pelvis W Contrast  01/08/2014   CLINICAL DATA:  Lower extremity swelling. Concern for vascular obstruction.  EXAM: CT ABDOMEN AND PELVIS WITH CONTRAST  TECHNIQUE: Multidetector CT imaging of the abdomen and pelvis was performed using the standard protocol following bolus administration of intravenous contrast.  CONTRAST:  72m OMNIPAQUE IOHEXOL 300 MG/ML  SOLN  COMPARISON:  CT thorax 12/23/2013  FINDINGS: Lower chest: Interval removal of the large left pleural  effusion. There is a residual mass at the left lung base measuring 7.0 x 4.6 cm. This appears solid without air bronchograms. There is a chest tube at the left lung base. There is a small pneumothorax anteriorly in the left lower lobe. Within the right lower lobe, there is a rounded nodule measuring 22 mm on image 9, series 3.  Hepatobiliary: No focal hepatic lesion.  The normal gallbladder.  Pancreas: Pancreas is normal. No ductal dilatation. No pancreatic inflammation.  Spleen: Normal spleen.  Adrenals/urinary tract: Nodule of the left adrenal gland measuring 20 mm. Right adrenal glands normal.  Kidneys, ureters, and bladder normal. Foley catheter is within a collapsed bladder.  Stomach/Bowel: Stomach, small bowel, appendix, cecum normal. The colon and rectosigmoid colon are normal.  Vascular/Lymphatic: Abdominal aorta is normal caliber. No retroperitoneal periportal lymphadenopathy.  In the pelvis there is multi lobular mass along the right iliac vessels. This mass appears to locally invade right common iliac vein (image 88, series 2). This multilobular mass measures approximately 7.3 x6.1 x 2.5 cm. There are multiple rounded components with low attenuation centrally suggesting necrosis. This mass approaches the inguinal canal and is likely palpable and would potentially be medial biopsy.  Reproductive: Prostate gland is normal.  No pelvic free fluid.  Musculoskeletal: No aggressive osseous lesion.  IMPRESSION: 1. Large mass in the left lower lobe is concerning for malignancy. 2. Rounded nodule at the right lower lobe is concerning for malignancy. Favor metastatic melanoma 3. Conglomerate round metastatic lesions along the right common iliac vessels concerning for metastatic melanoma. Lesions appear to invade the right common iliac vein. 4. Potential left adrenal gland metastasis. 5. Small left pneumothorax.   Electronically Signed   By: SSuzy BouchardM.D.   On: 01/08/2014 20:18   Dg Chest Port 1  View  01/09/2014   CLINICAL DATA:  Malignant pleural effusion.  EXAM: PORTABLE CHEST - 1 VIEW  COMPARISON:  01/08/2014  FINDINGS: Low lung volumes are again  noted.  Mild left lung base opacity likely atelectasis. Small effusion is suspected.  No pulmonary edema.  No pneumothorax.  Cardiac silhouette is mildly enlarged.  Normal mediastinal contours.  Left-sided chest tubes and left internal jugular central venous line are stable from the previous exam. Stable right anterior chest wall sequential pacemaker.  IMPRESSION: 1. No change from the previous day's study. 2. Mild persistent left lung base opacity likely combination of atelectasis with minimal residual pleural fluid. 3. Support apparatus is unchanged. No pulmonary edema. No pneumothorax.   Electronically Signed   By: Lajean Manes M.D.   On: 01/09/2014 08:15   Dg Chest Port 1 View  01/08/2014   CLINICAL DATA:  Malignant pleural effusion  EXAM: PORTABLE CHEST - 1 VIEW  COMPARISON:  01/16/2014; 01/03/2014; 01/05/2014; chest CT -01/01/2014  FINDINGS: Grossly unchanged enlarged cardiac silhouette and mediastinal contours. Minimal advancement of left jugular central venous catheter with tip overlying the midline of the mediastinum. Otherwise, stable position of support apparatus. No pneumothorax. Lung volumes remain reduced with grossly unchanged bibasilar heterogeneous opacities, left greater than right. Known nodule within the medial basilar segment of the right lung is unchanged. No new focal airspace opacities. Pulmonary vasculature remains indistinct with cephalization of flow. Unchanged trace bilateral effusions, left greater than right. Unchanged bones.  IMPRESSION: 1. Similar findings of near complete evacuation of left-sided pleural effusion post surgical drainage. 2. Stable positioning of support apparatus. No pneumothorax. Note, tip of left jugular approach central venous catheter overlies the midline of the mediastinum and osseus an indeterminate  location. Correlation for venous waveform and/or ABG is recommended to ensure appropriate venous location. 3. Similar findings of hypoventilation and bibasilar opacities, left greater than right, atelectasis versus infiltrate/ malignancy. Critical Value/emergent results were called by telephone at the time of interpretation on 01/08/2014 at 8:07 am to [place], [place] who verbally acknowledged these results.   Electronically Signed   By: Sandi Mariscal M.D.   On: 01/08/2014 08:17    Scheduled Meds: . sodium chloride   Intravenous Once  . albuterol  2.5 mg Nebulization TID  . allopurinol  200 mg Oral Daily  . amLODipine  5 mg Oral Daily  . antiseptic oral rinse  7 mL Mouth Rinse BID  . aspirin EC  81 mg Oral Daily  . bisacodyl  5 mg Oral BID  . carvedilol  6.25 mg Oral BID  . dextromethorphan-guaiFENesin  1 tablet Oral BID  . enoxaparin (LOVENOX) injection  40 mg Subcutaneous Daily  . HYDROcodone-acetaminophen  1 tablet Oral 3 times per day  . metoCLOPramide (REGLAN) injection  10 mg Intravenous 4 times per day  . mometasone-formoterol  2 puff Inhalation BID  . polyethylene glycol  17 g Oral BID  . senna-docusate  1 tablet Oral QHS   Continuous Infusions:     Aaron [name]  [facility] Pager [phone]. If 8PM-8AM, please contact night-coverage at [email or web address], password [facility] 01/09/2014, 12:39 PM  LOS: 7 days

## 2014-01-09 NOTE — Progress Notes (Addendum)
[address]       [place] [address]             [phone]       2 Days Post-Op Procedure(s) (LRB): Left  VIDEO ASSISTED THORACOSCOPY (VATS) with multiple biopsies(lung,pleural and pericardial fat pad bx's) (Left) DRAINAGE OF PLEURAL EFFUSION (Left) INSERTION PLEURAL DRAINAGE CATHETER (Left)  Subjective: Patient with cough-productive at times.  Objective: Vital signs in last 24 hours: Temp:  [97.7 F (36.5 C)-99.2 F (37.3 C)] 97.7 F (36.5 C) (12/19 0915) Pulse Rate:  [88-101] 94 (12/19 0915) Cardiac Rhythm:  [-] Ventricular paced (12/19 0700) Resp:  [12-19] 17 (12/19 0915) BP: (119-139)/(53-63) 133/62 mmHg (12/19 0915) SpO2:  [91 %-99 %] 99 % (12/19 0915) Weight:  [291 lb 7.2 oz (132.2 kg)] 291 lb 7.2 oz (132.2 kg) (12/19 0343)     Intake/Output from previous day: 12/18 0701 - 12/19 0700 In: 1792.5 [P.O.:240; I.V.:1522.5; Blood:30] Out: 7124 [Urine:775; Chest Tube:810]   Physical Exam:  Cardiovascular: Paced Pulmonary: Diminished at bases L>R Abdomen: Soft, non tender, obese,bowel sounds present. Extremities: Bilateral lower extremity edema. Wound: Clean and dry. Chest Tubes: to water seal and no air leak.  Lab Results: CBC: Recent Labs  01/08/14 0318 01/09/14 0320  WBC 20.0* 20.7*  HGB 7.5* 6.7*  HCT 24.0* 20.8*  PLT 327 315   BMET:  Recent Labs  01/08/14 0318 01/09/14 0320  NA 135* 136*  K 5.9* 5.1  CL 101 101  CO2 23 24  GLUCOSE 140* 131*  BUN 18 24*  CREATININE 1.34 1.24  CALCIUM 8.2* 8.3*    PT/INR: No results for input(s): LABPROT, INR in the last 72 hours. ABG:  INR: Will add last result for INR, ABG once components are confirmed Will add last 4 CBG results once components are confirmed  Assessment/Plan:  1. CV - Paced in the low 90's. On Coreg 6.25 bid and Norvasc 5 daily. 2.  Pulmonary - Chest tubes with 810 cc last 24 hours. Chest tubes are to water seal. Hope to remove one chest tube soon. CXR  shows persistent left base atelectasis, effusion and no pneumothorax. Mucinex for cough. Encourage incentive spirometer and flutter valve 3. Anemia- H and H decreased to 6.7 and 20.8. PRBCs ordered. 4. Metastatic melanoma-Oncology evaluated yesterday. BRAF to be sent 5. Leukocytosis-WBC remains 20,000. Low grade fever. Possibly related to atelectasis/SIRS 6. CBGs 136/132/191. Not diabetic. Will stop accu checks and SS PRN.  ZIMMERMAN,DONIELLE MPA-C 01/09/2014,9:58 AM  Still draining large volumes from chest tubes Abdomen is markedly distended- was given miralax, will give reglan x 48 hours The persistent left base "atelectasis" is unlikely to improve as I think it is all tumor based on intraoperative findings

## 2014-01-09 NOTE — Progress Notes (Signed)
Subjective/Objective:  [name] with Stage IV melanoma sp VATS with chest tube. Evaluated patient for increase in abdominal distension and pain 10/10 on 1-10 pain scale.   Scheduled Meds: . sodium chloride   Intravenous Once  . albuterol  2.5 mg Nebulization TID  . allopurinol  200 mg Oral Daily  . amLODipine  5 mg Oral Daily  . antiseptic oral rinse  7 mL Mouth Rinse BID  . aspirin EC  81 mg Oral Daily  . bisacodyl  5 mg Oral BID  . carvedilol  6.25 mg Oral BID  . dextromethorphan-guaiFENesin  1 tablet Oral BID  . enoxaparin (LOVENOX) injection  40 mg Subcutaneous Daily  . metoCLOPramide (REGLAN) injection  10 mg Intravenous 4 times per day  . mometasone-formoterol  2 puff Inhalation BID  . polyethylene glycol  17 g Oral BID  . senna-docusate  1 tablet Oral QHS   Continuous Infusions: . morphine     PRN Meds:methocarbamol, ondansetron **OR** ondansetron (ZOFRAN) IV, polyethylene glycol, potassium chloride, promethazine **OR** promethazine  Vital signs in last 24 hours: Temp:  [97.7 F (36.5 C)-99.2 F (37.3 C)] 98.7 F (37.1 C) (12/19 2018) Pulse Rate:  [85-101] 100 (12/19 2018) Resp:  [12-20] 20 (12/19 2018) BP: (122-150)/(53-75) 139/75 mmHg (12/19 2018) SpO2:  [92 %-99 %] 94 % (12/19 2018) Weight:  [132.2 kg (291 lb 7.2 oz)] 132.2 kg (291 lb 7.2 oz) (12/19 0343)  Intake/Output last 3 shifts: I/O last 3 completed shifts: In: 2592.5 [P.O.:240; I.V.:1722.5; Blood:630] Out: 1985 [Urine:875; Chest Tube:1110] Intake/Output this shift:  Physical Examination: General appearance - moaning with pain, lethargic but responds to questions and answers appropriately. Chest - Diminished throughout with limited chest expansion, no acute dyspnea Heart - normal rate and regular rhythm Abdomen - distended, no bowel sounds appreciated  Problem Assessment/Plan:   Abdominal distension: Preliminary xray of abd with dilated loops of large bowel. Will place rectal tube to attempt  decompression.   Goals of care: patient and family expressed desire for pain management / comfort measures. Patient does not want chest compression, defibrillation or mechanical ventilation. Due to the poor prognosis related to Stage IV melanoma patient and family want to focus on comfort. Started Morphine drip. Code status : DNR.

## 2014-01-09 NOTE — Progress Notes (Signed)
Pt had critical Hgb of 6.7. On call MD paged. New orders received. Pt to get 2 units of blood. Will continue to monitor.

## 2014-01-09 NOTE — Progress Notes (Signed)
CRITICAL VALUE ALERT  Critical value received:  Hgb 6.7  Date of notification:  01/09/2014  Time of notification:  1950  Critical value read back:Yes.    Nurse who received alert:  Dwan Bolt RN  MD notified (1st page):  Alene Mires, [place] with [facility]  Time of first page:  0420  MD notified (2nd page):  Time of second page:  Responding MD:  Alene Mires, [place]  Time MD responded:  [phone]

## 2014-01-10 ENCOUNTER — Inpatient Hospital Stay (HOSPITAL_COMMUNITY): Payer: Medicare Other

## 2014-01-10 LAB — TYPE AND SCREEN
ABO/RH(D): O NEG
Antibody Screen: NEGATIVE
UNIT DIVISION: 0
Unit division: 0
Unit division: 0

## 2014-01-10 LAB — CBC
HCT: 23.7 % — ABNORMAL LOW (ref 39.0–52.0)
Hemoglobin: 7.8 g/dL — ABNORMAL LOW (ref 13.0–17.0)
MCH: 31.8 pg (ref 26.0–34.0)
MCHC: 32.9 g/dL (ref 30.0–36.0)
MCV: 96.7 fL (ref 78.0–100.0)
Platelets: 309 10*3/uL (ref 150–400)
RBC: 2.45 MIL/uL — ABNORMAL LOW (ref 4.22–5.81)
RDW: 15.9 % — ABNORMAL HIGH (ref 11.5–15.5)
WBC: 23.4 10*3/uL — ABNORMAL HIGH (ref 4.0–10.5)

## 2014-01-10 LAB — BASIC METABOLIC PANEL
Anion gap: 13 (ref 5–15)
BUN: 38 mg/dL — AB (ref 6–23)
CHLORIDE: 99 meq/L (ref 96–112)
CO2: 24 mEq/L (ref 19–32)
CREATININE: 1.17 mg/dL (ref 0.50–1.35)
Calcium: 8 mg/dL — ABNORMAL LOW (ref 8.4–10.5)
GFR calc Af Amer: 65 mL/min — ABNORMAL LOW (ref >90)
GFR calc non Af Amer: 56 mL/min — ABNORMAL LOW (ref >90)
GLUCOSE: 166 mg/dL — AB (ref 70–99)
Potassium: 4.7 mEq/L (ref 3.7–5.3)
Sodium: 136 mEq/L — ABNORMAL LOW (ref 137–147)

## 2014-01-10 LAB — BODY FLUID CULTURE: Culture: NO GROWTH

## 2014-01-10 MED ORDER — POLYVINYL ALCOHOL 1.4 % OP SOLN
1.0000 [drp] | Freq: Four times a day (QID) | OPHTHALMIC | Status: DC | PRN
  Filled 2014-01-10: qty 15

## 2014-01-10 MED ORDER — ACETAMINOPHEN 650 MG RE SUPP: 650.0000 mg | Freq: Four times a day (QID) | RECTAL | Status: DC | PRN

## 2014-01-10 MED ORDER — LORAZEPAM 2 MG/ML IJ SOLN
INTRAMUSCULAR | Status: AC
  Filled 2014-01-10: qty 1

## 2014-01-10 MED ORDER — ACETAMINOPHEN 325 MG PO TABS: 650.0000 mg | ORAL_TABLET | Freq: Four times a day (QID) | ORAL | Status: DC | PRN

## 2014-01-10 MED ORDER — ATROPINE SULFATE 1 % OP SOLN
4.0000 [drp] | OPHTHALMIC | Status: DC | PRN
  Administered 2014-01-10 (×2): 4 [drp] via SUBLINGUAL
  Filled 2014-01-10: qty 2

## 2014-01-10 MED ORDER — SCOPOLAMINE 1 MG/3DAYS TD PT72
1.0000 | MEDICATED_PATCH | TRANSDERMAL | Status: DC
  Administered 2014-01-10: 1.5 mg via TRANSDERMAL
  Filled 2014-01-10: qty 1

## 2014-01-10 MED ORDER — MORPHINE BOLUS VIA INFUSION
2.0000 mg | INTRAVENOUS | Status: DC | PRN
  Administered 2014-01-10: 2 mg via INTRAVENOUS
  Filled 2014-01-10 (×2): qty 2

## 2014-01-10 MED ORDER — LORAZEPAM 2 MG/ML IJ SOLN
1.0000 mg | INTRAMUSCULAR | Status: DC | PRN
  Administered 2014-01-10 (×3): 2 mg via INTRAVENOUS
  Administered 2014-01-10: 1 mg via INTRAVENOUS
  Administered 2014-01-10: 2 mg via INTRAVENOUS
  Filled 2014-01-10 (×4): qty 1

## 2014-01-10 MED ORDER — GLYCOPYRROLATE 0.2 MG/ML IJ SOLN
0.1000 mg | INTRAMUSCULAR | Status: DC
  Administered 2014-01-10 (×4): 0.1 mg via INTRAVENOUS
  Filled 2014-01-10 (×6): qty 0.5

## 2014-01-10 NOTE — Progress Notes (Addendum)
[facility] PROGRESS NOTE  Interval History: 80 y.o. male past medical history of cigar use, chronic diastolic heart failure and moderate aortic stenosis, with PFTs done about 2 years ago which were within normal limits, followed by Dr. Shyrl Numbers relates his symptoms at that time were from his obesity, also with melanoma in the right foot status post excision 3 years ago. CT showed pleural effusion. Consult IR for thoracocentesis which showed bloody fluids with atypical cells. Consult CT surgery which proceded with with VATS and showed multiple nodule in lung heart and pleura. Chest tube inserted, surgical pathology showing metastatic melanoma. Patient had acute decompensation on 12/19 at night, a candidate for nausea, abdominal distention, shortness of breath, x-ray abdomen showing ileus, but no evidence of obstruction, patient and family made the decision of comfort care, so comfort care measures were initiated 12/19 at night, palliative care were consulted 12/20 in a.m..  Assessment/Plan: Acute respiratory failure with hypoxia due to  Malignant Pleural effusion, left: - Ct angio of chest as below. - S/p US guided thoracocentesis.  - consulted cardiothoracic surgery who recommended VATS with  Biopsy and chest tube. extensive tumor involving visceral and parietal pleura and pericardial fat pad appeared to be encasement and/or replacement of left lower lobe with tumor. -Surgical pathology showing metastatic malignant melanoma - poor prognosis. - Chest tube still draining a large amount of bloody fluid, management as per CT surgery.  Metastatic malignant melanoma -Patient has been made comfort care on 12/19, comfort care measures were fully implemented, palliative care were consulted, continue with morphine drip, Ativan as needed for agitation, Robinul for shortness of breath. -Oncology consult appreciated.  Leukocytosis: - Continues to worsen  Hyperkalemia: - Monitor  Anemia: -  patient is receiving 2 units packed red blood cell transfusion 12/19,   Swelling of lower extremity - doppler negative for DVT. - ct abdo men and pelvis showing tumor involving right iliac vessels.  Chronic diastolic heart failure - Hold lasix.  - place ted hose.  H/O Malignant melanoma of right foot       Code Status: DO NOT RESUSCITATE/Comfort Care, palliative care consulted. Discussed with daughter at bedside, they wish to proceed with comfort care measures only. Family Communication: none  Disposition Plan: Patient is actively dying.   Consultants:  IR  PCCM  Cardiothoracic surgery  Oncology  Palliative care  Procedures:  CXR 12.13.2015: Smaller left pleural effusion. No pneumothorax. Ct angio 12.12.2015; no PE, pleural effusion, 2.4 cm well-defined soft tissue mass in the right lower lobe  adjacent to the segmental bronchovascular structures  2 units back to her blood cell transfusion 12/19.  Antibiotics:  None  HPI/Subjective: Patient is currently on morphine drip, appears to be lethargic, episodes of agitation trying to stand up from bed.  Objective: Filed Vitals:   01/09/14 2018 01/10/14 0008 01/10/14 0358 01/10/14 0745  BP: 139/75 148/66 155/80   Pulse: 100 103 109 109  Temp: 98.7 F (37.1 C) 99 F (37.2 C) 98.6 F (37 C)   TempSrc: Oral Axillary Axillary   Resp: 20 17 11 17   Height:      Weight:   133.4 kg (294 lb 1.5 oz)   SpO2: 94% 91% 89% 95%    Intake/Output Summary (Last 24 hours) at 01/10/14 1055 Last data filed at 01/10/14 0359  Gross per 24 hour  Intake 466.18 ml  Output    875 ml  Net -408.82 ml   Filed Weights   01/08/14 0404 01/09/14 0343 01/10/14 0358  Weight: 131.9 kg (290 lb 12.6 oz) 132.2 kg (291 lb 7.2 oz) 133.4 kg (294 lb 1.5 oz)    Exam:  General: Sleepy, episodes of agitation HEENT: No bruits, no goiter.  Heart: Regular rate and rhythm. Lungs: Poor air entry, basilar congestion. Abdomen: Distended,  diminished bowel sounds.   Data Reviewed: Basic Metabolic Panel:  Recent Labs Lab 01/04/14 0525 01/05/14 0313 01/08/14 0318 01/09/14 0320 01/10/14 0336  NA 132* 137 135* 136* 136*  K 3.6* 3.4* 5.9* 5.1 4.7  CL 95* 99 101 101 99  CO2 26 25 23 24 24   GLUCOSE 116* 109* 140* 131* 166*  BUN 18 16 18  24* 38*  CREATININE 1.10 1.01 1.34 1.24 1.17  CALCIUM 8.3* 8.3* 8.2* 8.3* 8.0*   Liver Function Tests:  Recent Labs Lab 01/03/14 1200 01/09/14 0320  AST 21 17  ALT 17 18  ALKPHOS 68 69  BILITOT 0.3 0.4  PROT 6.5 6.3  ALBUMIN 3.2* 2.6*   No results for input(s): LIPASE, AMYLASE in the last 168 hours. No results for input(s): AMMONIA in the last 168 hours. CBC:  Recent Labs Lab 01/04/14 1140 01/08/14 0318 01/09/14 0320 01/09/14 1800 01/10/14 0336  WBC 11.9* 20.0* 20.7* 27.9* 23.4*  HGB 8.0* 7.5* 6.7* 8.1* 7.8*  HCT 23.7* 24.0* 20.8* 24.7* 23.7*  MCV 97.9 98.8 97.7 95.7 96.7  PLT 234 327 315 333 309   Cardiac Enzymes: No results for input(s): CKTOTAL, CKMB, CKMBINDEX, TROPONINI in the last 168 hours. BNP (last 3 results)  Recent Labs  12/24/2013 0930  PROBNP 340.4   CBG:  Recent Labs Lab 01/08/14 0553 01/08/14 1223 01/08/14 1830 01/08/14 2355 01/09/14 0727  GLUCAP 144* 133* 136* 132* 191*    Recent Results (from the past 240 hour(s))  Culture, blood (routine x 2)     Status: None   Collection Time: 12/22/2013  4:20 PM  Result Value Ref Range Status   Specimen Description BLOOD LEFT ARM  Final   Special Requests BOTTLES DRAWN AEROBIC AND ANAEROBIC 10 CC  Final   Culture  Setup Time   Final    01/19/2014 22:46 Performed at [facility]    Culture   Final    NO GROWTH 5 DAYS Performed at [facility]    Report Status 01/08/2014 FINAL  Final  Culture, blood (routine x 2)     Status: None   Collection Time: 12/25/2013  4:30 PM  Result Value Ref Range Status   Specimen Description BLOOD RIGHT HAND  Final   Special Requests BOTTLES  DRAWN AEROBIC ONLY 5 ML  Final   Culture  Setup Time   Final    01/13/2014 22:46 Performed at [facility]    Culture   Final    NO GROWTH 5 DAYS Performed at [facility]    Report Status 01/08/2014 FINAL  Final  Body fluid culture     Status: None   Collection Time: 01/03/14  9:33 AM  Result Value Ref Range Status   Specimen Description PLEURAL LEFT FLUID  Final   Special Requests NONE  Final   Gram Stain   Final    MODERATE WBC PRESENT,BOTH PMN AND MONONUCLEAR NO SQUAMOUS EPITHELIAL CELLS SEEN NO ORGANISMS SEEN Performed at [facility]    Culture   Final    NO GROWTH 3 DAYS Performed at [facility]    Report Status 01/08/2014 FINAL  Final  Fungus Culture with Smear     Status:  None (Preliminary result)   Collection Time: 01/14/2014 10:36 AM  Result Value Ref Range Status   Specimen Description FLUID PLEURAL LEFT  Final   Special Requests NONE  Final   Fungal Smear   Final    NO YEAST OR FUNGAL ELEMENTS SEEN Performed at [facility]    Culture   Final    CULTURE IN PROGRESS FOR FOUR WEEKS Performed at [facility]    Report Status PENDING  Incomplete  Body fluid culture     Status: None (Preliminary result)   Collection Time: 01/21/2014 10:36 AM  Result Value Ref Range Status   Specimen Description FLUID PLEURAL LEFT  Final   Special Requests NONE  Final   Gram Stain   Final    FEW WBC PRESENT, PREDOMINANTLY MONONUCLEAR NO ORGANISMS SEEN Performed at [facility]    Culture   Final    NO GROWTH 2 DAYS Performed at [facility]    Report Status PENDING  Incomplete  AFB culture with smear     Status: None (Preliminary result)   Collection Time: 01/06/2014 10:36 AM  Result Value Ref Range Status   Specimen Description FLUID PLEURAL LEFT  Final   Special Requests NONE  Final   Acid Fast Smear   Final    NO ACID FAST BACILLI SEEN Performed at [facility]    Culture   Final    CULTURE  WILL BE EXAMINED FOR 6 WEEKS BEFORE ISSUING A FINAL REPORT Performed at [facility]    Report Status PENDING  Incomplete     Studies: Ct Abdomen Pelvis W Contrast  01/08/2014   CLINICAL DATA:  Lower extremity swelling. Concern for vascular obstruction.  EXAM: CT ABDOMEN AND PELVIS WITH CONTRAST  TECHNIQUE: Multidetector CT imaging of the abdomen and pelvis was performed using the standard protocol following bolus administration of intravenous contrast.  CONTRAST:  50mL OMNIPAQUE IOHEXOL 300 MG/ML  SOLN  COMPARISON:  CT thorax 01/16/2014  FINDINGS: Lower chest: Interval removal of the large left pleural effusion. There is a residual mass at the left lung base measuring 7.0 x 4.6 cm. This appears solid without air bronchograms. There is a chest tube at the left lung base. There is a small pneumothorax anteriorly in the left lower lobe. Within the right lower lobe, there is a rounded nodule measuring 22 mm on image 9, series 3.  Hepatobiliary: No focal hepatic lesion.  The normal gallbladder.  Pancreas: Pancreas is normal. No ductal dilatation. No pancreatic inflammation.  Spleen: Normal spleen.  Adrenals/urinary tract: Nodule of the left adrenal gland measuring 20 mm. Right adrenal glands normal.  Kidneys, ureters, and bladder normal. Foley catheter is within a collapsed bladder.  Stomach/Bowel: Stomach, small bowel, appendix, cecum normal. The colon and rectosigmoid colon are normal.  Vascular/Lymphatic: Abdominal aorta is normal caliber. No retroperitoneal periportal lymphadenopathy.  In the pelvis there is multi lobular mass along the right iliac vessels. This mass appears to locally invade right common iliac vein (image 88, series 2). This multilobular mass measures approximately 7.3 x6.1 x 2.5 cm. There are multiple rounded components with low attenuation centrally suggesting necrosis. This mass approaches the inguinal canal and is likely palpable and would potentially be medial biopsy.   Reproductive: Prostate gland is normal.  No pelvic free fluid.  Musculoskeletal: No aggressive osseous lesion.  IMPRESSION: 1. Large mass in the left lower lobe is concerning for malignancy. 2. Rounded nodule at the right lower lobe is  concerning for malignancy. Favor metastatic melanoma 3. Conglomerate round metastatic lesions along the right common iliac vessels concerning for metastatic melanoma. Lesions appear to invade the right common iliac vein. 4. Potential left adrenal gland metastasis. 5. Small left pneumothorax.   Electronically Signed   By: Suzy Bouchard M.D.   On: 01/08/2014 20:18   Dg Chest Port 1 View  01/09/2014   CLINICAL DATA:  Malignant pleural effusion.  EXAM: PORTABLE CHEST - 1 VIEW  COMPARISON:  01/08/2014  FINDINGS: Low lung volumes are again noted.  Mild left lung base opacity likely atelectasis. Small effusion is suspected.  No pulmonary edema.  No pneumothorax.  Cardiac silhouette is mildly enlarged.  Normal mediastinal contours.  Left-sided chest tubes and left internal jugular central venous line are stable from the previous exam. Stable right anterior chest wall sequential pacemaker.  IMPRESSION: 1. No change from the previous day's study. 2. Mild persistent left lung base opacity likely combination of atelectasis with minimal residual pleural fluid. 3. Support apparatus is unchanged. No pulmonary edema. No pneumothorax.   Electronically Signed   By: Lajean Manes M.D.   On: 01/09/2014 08:15   Dg Abd Portable 1v  01/09/2014   CLINICAL DATA:  Follow-up ileus.  Subsequent encounter.  EXAM: PORTABLE ABDOMEN - 1 VIEW  COMPARISON:  CT of the abdomen and pelvis performed 01/08/2014, and abdominal radiograph performed 10/05/2004  FINDINGS: The visualized bowel gas pattern is nonspecific. There is mild distention of small and large bowel loops, which given clinical concern may reflect mild ileus. There is no definite evidence of distal obstruction, though the pelvis is incompletely  assessed on this study. No free intra-abdominal air is identified, though evaluation for free air is limited on a single supine view.  The visualized osseous structures are within normal limits; the sacroiliac joints are unremarkable in appearance.  IMPRESSION: Mild distention of small and large bowel loops, without definite evidence of distal obstruction. Given clinical concern, this may reflect mild ileus, new from the recent prior CT. No free intra-abdominal air seen.   Electronically Signed   By: Garald Balding M.D.   On: 01/09/2014 23:53    Scheduled Meds: . albuterol  2.5 mg Nebulization TID  . antiseptic oral rinse  7 mL Mouth Rinse BID  . glycopyrrolate  0.1 mg Intravenous Q4H  . LORazepam      . scopolamine  1 patch Transdermal Q72H   Continuous Infusions: . morphine 5 mg/hr (01/10/14 1000)   Time spent: 35 minutes.  [facility], Anthonella Klausner  [facility] Pager [phone]. If 8PM-8AM, please contact night-coverage at [email or web address], password [facility] 01/10/2014, 10:55 AM  LOS: 8 days

## 2014-01-10 NOTE — Progress Notes (Signed)
Palliative Care Team at [place] Note   SUBJECTIVE: Acute changes and deterioration overnight. Severe dyspnea, hypotension, AMS.  OBJECTIVE: Vital Signs: BP 155/80 mmHg  Pulse 109  Temp(Src) 98.6 F (37 C) (Axillary)  Resp 11  Ht 6\' 2"  (1.88 m)  Wt 133.4 kg (294 lb 1.5 oz)  BMI 37.74 kg/m2  SpO2 89%   Intake and Output: 12/19 0701 - 12/20 0700 In: 866.2 [I.V.:266.2; Blood:600] Out: 975 [Urine:575; Chest Tube:400]  Physical Exam: General: Pale, Severe dyspnea  Head: Normal  Lungs:  CT in place, diffuse Rhonchi  Heart: Tachycardic  Abdomen:  Massively distended  Extremities: +edema    Allergies  Allergen Reactions  . Hydrochlorothiazide Other (See Comments)    ? Causes gout  . Oxycodone Hcl Nausea And Vomiting    Medications: Scheduled Meds:  . albuterol  2.5 mg Nebulization TID  . antiseptic oral rinse  7 mL Mouth Rinse BID  . glycopyrrolate  0.1 mg Intravenous Q4H  . LORazepam      . scopolamine  1 patch Transdermal Q72H    Continuous Infusions: . morphine 5 mg/hr (01/10/14 0847)    PRN Meds: [DISCONTINUED] acetaminophen **OR** acetaminophen, atropine, LORazepam, morphine, [DISCONTINUED] ondansetron **OR** ondansetron (ZOFRAN) IV, polyvinyl alcohol, [DISCONTINUED] promethazine **OR** promethazine  Stool Softner: yes  Palliative Performance Scale: 10 %   Labs: CBC    Component Value Date/Time   WBC 23.4* 01/10/2014 0336   RBC 2.45* 01/10/2014 0336   HGB 7.8* 01/10/2014 0336   HCT 23.7* 01/10/2014 0336   PLT 309 01/10/2014 0336   MCV 96.7 01/10/2014 0336   MCH 31.8 01/10/2014 0336   MCHC 32.9 01/10/2014 0336   RDW 15.9* 01/10/2014 0336   LYMPHSABS 2.2 01/14/2014 0930   MONOABS 1.4* 01/16/2014 0930   EOSABS 0.1 12/23/2013 0930   BASOSABS 0.1 01/15/2014 0930    CMET     Component Value Date/Time   NA 136* 01/10/2014 0336   K 4.7 01/10/2014 0336   CL 99 01/10/2014 0336   CO2 24 01/10/2014 0336   GLUCOSE 166* 01/10/2014 0336    BUN 38* 01/10/2014 0336   CREATININE 1.17 01/10/2014 0336   CALCIUM 8.0* 01/10/2014 0336   PROT 6.3 01/09/2014 0320   ALBUMIN 2.6* 01/09/2014 0320   AST 17 01/09/2014 0320   ALT 18 01/09/2014 0320   ALKPHOS 69 01/09/2014 0320   BILITOT 0.4 01/09/2014 0320   GFRNONAA 56* 01/10/2014 0336   GFRAA 65* 01/10/2014 0336   ASSESSMENT/ PLAN: Aaron [name] is actively dying of complications related to metastatic melanoma. Rapidly deteriorating from respiratory failure. Chest tubes in place, abdomen is distended.  Symptoms: Pain, Dyspnea, Secretions  Goals have shifted to full comfort care.  Initiate a morphine infusion with titration orders and bolus dosing, Very congested and fluid overloaded-started Robinul. Ativan for agitation.  Provided support to his family.    Greater than 50%  of this time was spent counseling and coordinating care related to the above assessment and plan.   Acquanetta Chain, DO  01/10/2014, 9:46 AM  Please contact Palliative Medicine Team phone at [phone] for questions and concerns.

## 2014-01-10 NOTE — Progress Notes (Signed)
Aaron [name]       [place] [id number]             [phone]       3 Days Post-Op Procedure(s) (LRB): Left  VIDEO ASSISTED THORACOSCOPY (VATS) with multiple biopsies(lung,pleural and pericardial fat pad bx's) (Left) DRAINAGE OF PLEURAL EFFUSION (Left) INSERTION PLEURAL DRAINAGE CATHETER (Left)  Subjective: Family at bedside. Patient has clinically worsened in the last 24 hours (hypotension, AMS)  Objective: Vital signs in last 24 hours: Temp:  [98 F (36.7 C)-99 F (37.2 C)] 98.6 F (37 C) (12/20 0358) Pulse Rate:  [85-109] 109 (12/20 0745) Cardiac Rhythm:  [-] Ventricular paced (12/20 0358) Resp:  [11-20] 17 (12/20 0745) BP: (139-155)/(66-80) 155/80 mmHg (12/20 0358) SpO2:  [89 %-96 %] 95 % (12/20 0745) Weight:  [294 lb 1.5 oz (133.4 kg)] 294 lb 1.5 oz (133.4 kg) (12/20 0358)     Intake/Output from previous day: 12/19 0701 - 12/20 0700 In: 866.2 [I.V.:266.2; Blood:600] Out: 975 [Urine:575; Chest Tube:400]   Physical Exam:  Cardiovascular: Paced, HR low 100's. Pulmonary: Coarse rhonchi bilaterally Abdomen: Soft, non tender, obese,bowel sounds present. Extremities: Bilateral lower extremity edema. Wound: Dressing is clean and dry Chest Tubes: to water seal and no air leak.  Lab Results: CBC:  Recent Labs  01/09/14 1800 01/10/14 0336  WBC 27.9* 23.4*  HGB 8.1* 7.8*  HCT 24.7* 23.7*  PLT 333 309   BMET:   Recent Labs  01/09/14 0320 01/10/14 0336  NA 136* 136*  K 5.1 4.7  CL 101 99  CO2 24 24  GLUCOSE 131* 166*  BUN 24* 38*  CREATININE 1.24 1.17  CALCIUM 8.3* 8.0*    PT/INR: No results for input(s): LABPROT, INR in the last 72 hours. ABG:  INR: Will add last result for INR, ABG once components are confirmed Will add last 4 CBG results once components are confirmed  Assessment/Plan:  1. CV - Paced in the low 100's.  2.  Pulmonary - Chest tubes with 400 cc last 24 hours. Chest tubes to remain for now. 3. Anemia- H  and H 7.8 and 23.7. 4. Metastatic melanoma-Oncology evaluated. BRAF was sent. 5. Leukocytosis-WBC decreased from 27.9 to 23.4. Low grade fever. Possibly related to atelectasis/SIRS 6.GI-KUB yesterday showed mild distention of small and large bowel, but no obstruction. Likely ileus.  NPO . 7. Because has worsen clinically, palliative care evaluated earlier this am-on morphine drip for comfort care  ZIMMERMAN,DONIELLE MPA-C 01/10/2014,10:44 AM

## 2014-01-10 NOTE — Progress Notes (Signed)
Pt was found to be in mild resp distress and increased abd distention and discomfort. Family voiced concerns about pt comfort level. Pt pox was 87 on 1 lt of o2 pt was increase to 4 lt of o2 and sats stabilized at 92%. NP was paged and rapid response was page.  2030 rapid response at bed side and evaluated pt. 2040 Np at bed side and evaluated pt. Spoke in depth with family about pt status and goals of care. Np placed orders according to family and pt wishes with comfort as the focus of the pt care. Will continue to monitor pt.

## 2014-01-10 NOTE — Progress Notes (Signed)
Chaplain responded to request for family support while patient is actively dying.  (There was some delay in responding as the room number was listed incorrectly on the page.) Two daughters Leda Gauze and [place]), one granddaughter Sharyn Lull), and ex-wife Anne Ng) were present.  Son-in-law Gershon Mussel) arrived later.  Offered emotional and spiritual support, including prayer, bedside with the patient.  There appears to be some mild tension between ex-wife and family although it seems that all parties are being cordial. Although Anne Ng and patient divorced some time ago, they went out to eat "3 to 4 times a week".  Daughters believe that if Anne Ng were still married to their father, they (the daughters) would not be allowed in the room.  However, the daughters indicated they felt comfortable being clear about their needs and want everybody (including Anne Ng) to have the private time needed with patient.  Note: Son-in-law lost his father in this same hospital unit 6 years ago and now his wife is losing her father there.  For this reason, they are not currently comfortable using the conference room.  Family is active in the [employer] but they are not close with their pastor, who is new.  Please call as needed.  Will recommend to the floor chaplain for ongoing support.  Luana Shu [phone]   01/10/14 1400  Clinical Encounter Type  Visited With Patient and family together  Visit Type Spiritual support  Referral From Nurse  Spiritual Encounters  Spiritual Needs Prayer;Emotional;Grief support  Stress Factors  Family Stress Factors Family relationships;Major life changes

## 2014-01-11 DIAGNOSIS — J910 Malignant pleural effusion: Secondary | ICD-10-CM | POA: Insufficient documentation

## 2014-01-11 DIAGNOSIS — R0602 Shortness of breath: Secondary | ICD-10-CM | POA: Insufficient documentation

## 2014-01-22 NOTE — Discharge Summary (Signed)
Brief death summary: Interval History: 81 y.o. male past medical history of cigar use, chronic diastolic heart failure and moderate aortic stenosis, with PFTs done about 2 years ago which were within normal limits, followed by Dr. Shyrl Numbers relates his symptoms at that time were from his obesity, also with melanoma in the right foot status post excision 3 years ago. CT showed pleural effusion. Consult IR for thoracocentesis which showed bloody fluids with atypical cells. Consult CT surgery which proceded with with VATS and showed multiple nodule in lung heart and pleura. Chest tube inserted, surgical pathology showing metastatic melanoma. Patient had acute decompensation on 12/19 at night, a candidate for nausea, abdominal distention, shortness of breath, x-ray abdomen showing ileus, but no evidence of obstruction, patient and family made the decision of comfort care, so comfort care measures were initiated 12/19 at night, palliative care were consulted 12/20 in a.m. patient was started on morphine drip, when necessary Ativan. As per documentation patient expired at [place], family at bedside. Phillips Climes MD.

## 2014-01-22 NOTE — Progress Notes (Signed)
Patient expired at [place]. Family at bedside. Emotional support given to family members. Heather RN and this Probation officer verified expiration. Family has funeral home picked out, [facility] in [place]. Family collected all belongings from room to take home. On call MD paged and notified. Alene Mires, [place] responded. Death certificate signed. House coverage was called and notified. [place] were called at [place] and notified of passing. Reference number [id number] was given. Post mortem care performed. All lines and drains were removed, except for pleurx catheter. Family remains at bedside awaiting for funeral home. Will continue to offer support to family.

## 2014-01-22 DEATH — deceased

## 2014-02-03 ENCOUNTER — Encounter: Payer: Medicare Other | Admitting: Internal Medicine

## 2014-02-04 LAB — FUNGUS CULTURE W SMEAR: Fungal Smear: NONE SEEN

## 2014-02-19 LAB — AFB CULTURE WITH SMEAR (NOT AT ARMC): Acid Fast Smear: NONE SEEN

## 2016-10-03 IMAGING — CT CT ABD-PELV W/ CM
2 of 5 series · 16 of 46 positions shown, 18 images · IV contrast (Omni 300)
Comparison: CT thorax 01/02/2014

CLINICAL DATA: Lower extremity swelling. Concern for vascular
obstruction.

EXAM:
CT ABDOMEN AND PELVIS WITH CONTRAST
TECHNIQUE: Multidetector CT imaging of the abdomen and pelvis was performed
using the standard protocol following bolus administration of
intravenous contrast.
CONTRAST:  80mL OMNIPAQUE IOHEXOL 300 MG/ML  SOLN

[Series 2: abd/ pelvis 5.0 i30f 1 · axial · 0.88mm/px · z∈[-395,+85]mm · 13 of 109 slices shown, 15 images]
[im 7/109  soft-tissue]
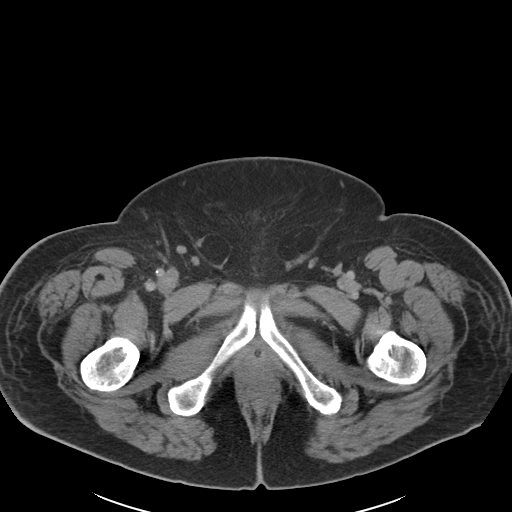
[im 7/109  bone]
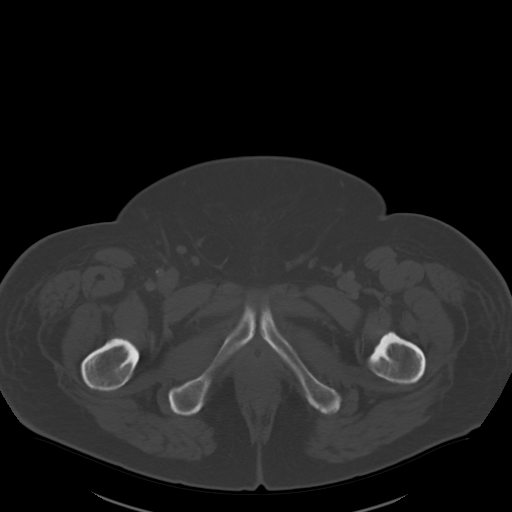
[im 13/109  soft-tissue]
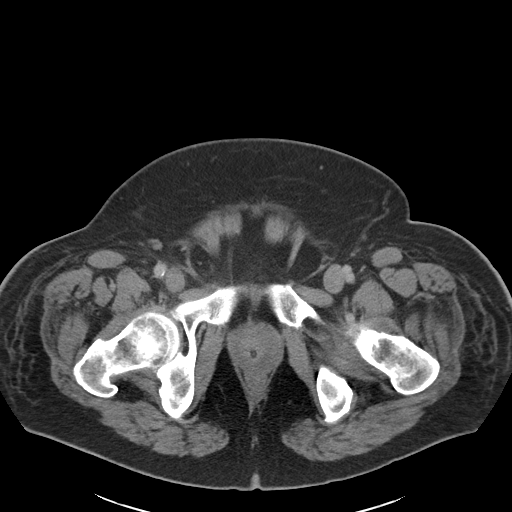
[im 25/109  soft-tissue]
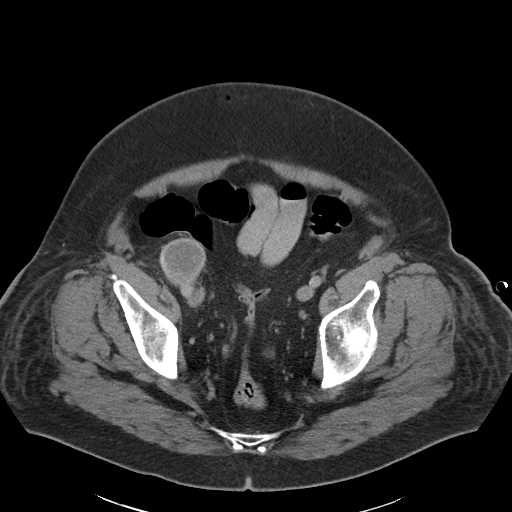
[im 31/109  soft-tissue]
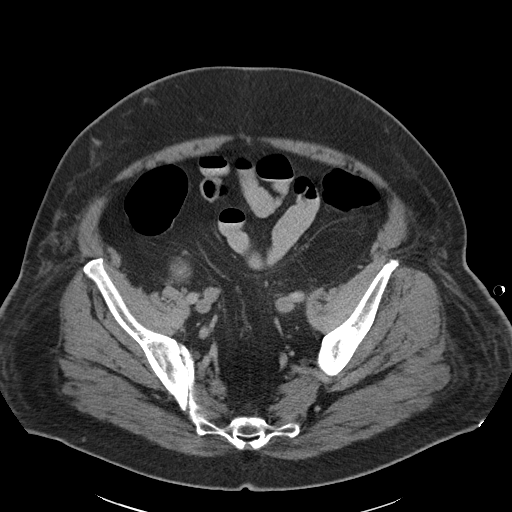
[im 37/109  soft-tissue]
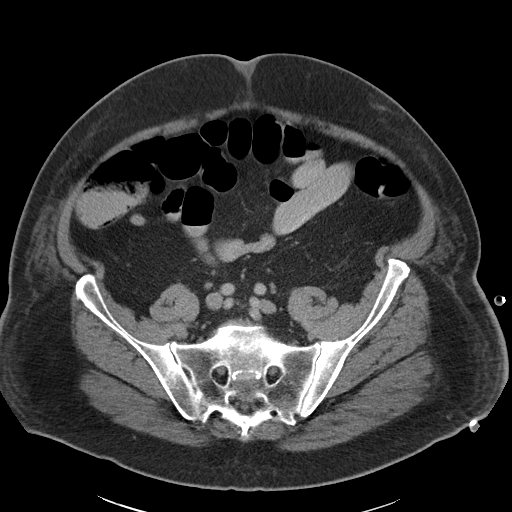
[im 49/109  soft-tissue]
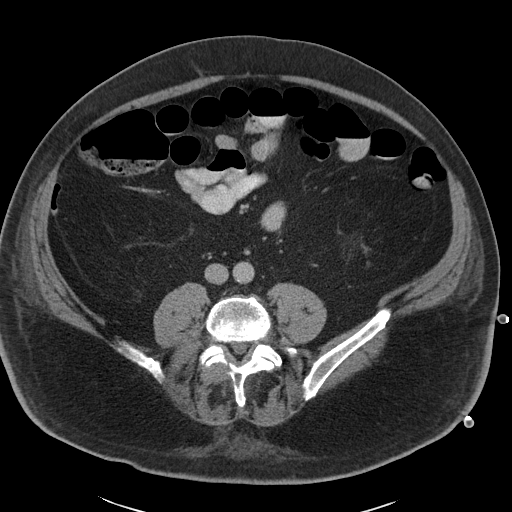
[im 55/109  soft-tissue]
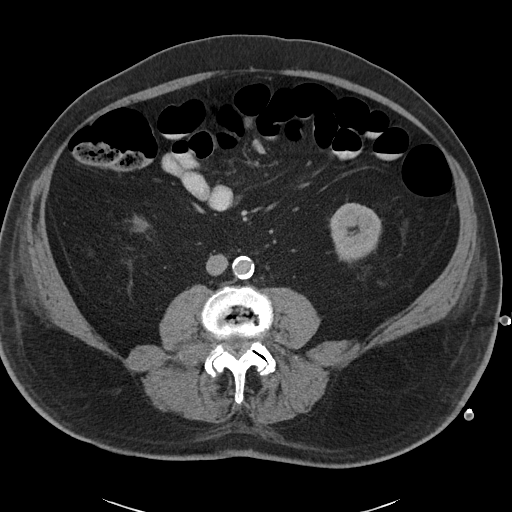
[im 61/109  soft-tissue]
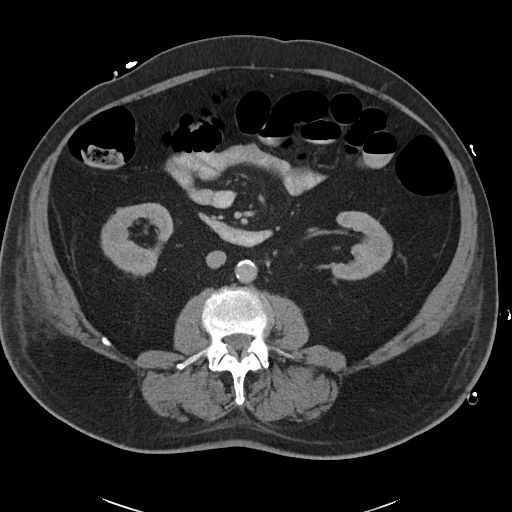
[im 73/109  soft-tissue]
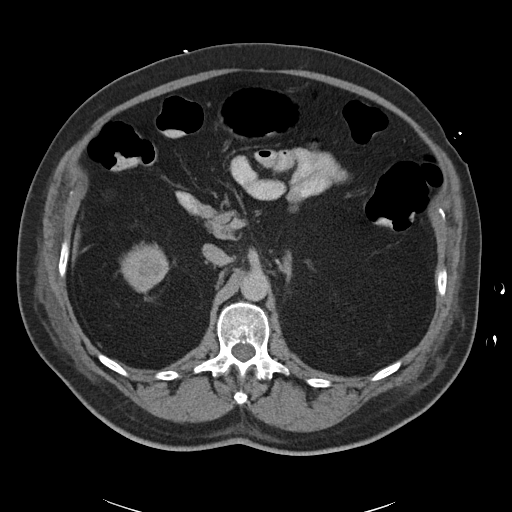
[im 73/109  bone]
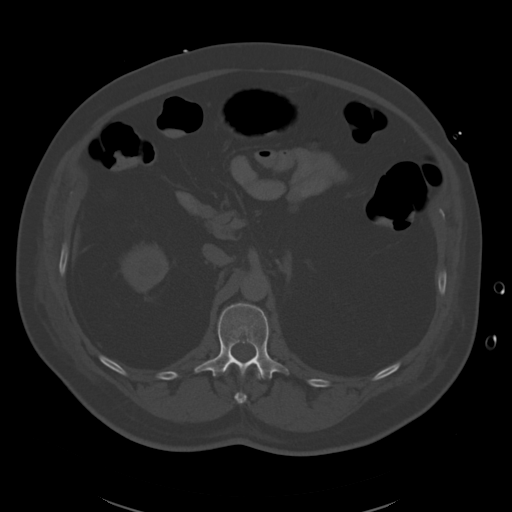
[im 79/109  soft-tissue]
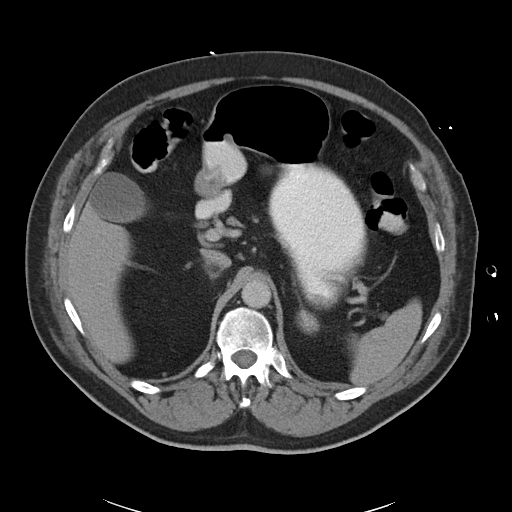
[im 85/109  soft-tissue]
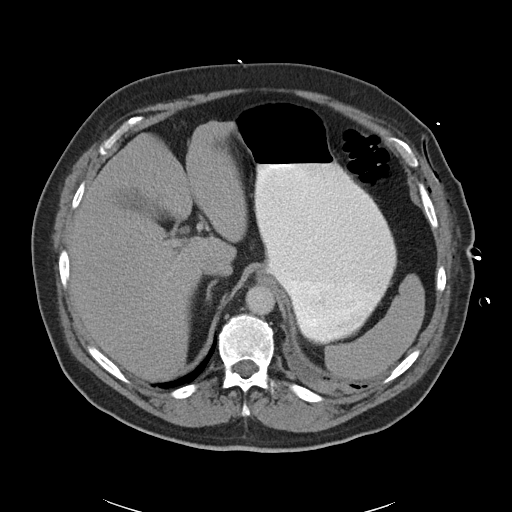
[im 97/109  soft-tissue]
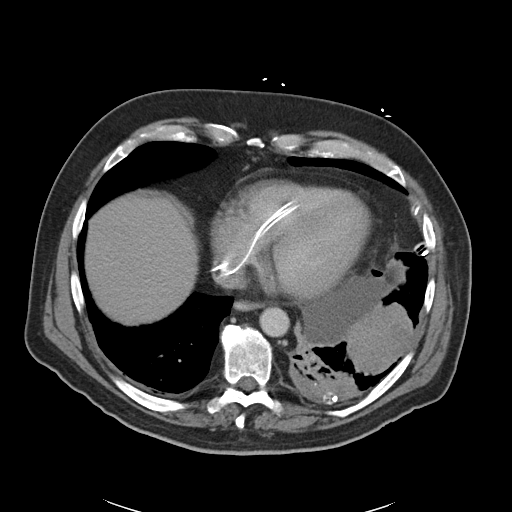
[im 103/109  soft-tissue]
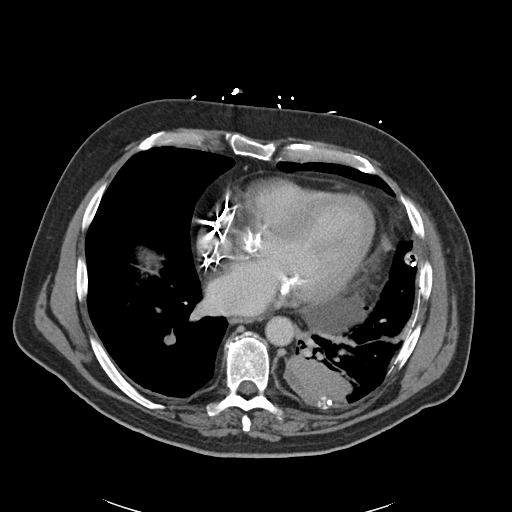

[Series 5: coronals · coronal · 0.91mm/px · 3 of 182 slices shown]
[im 61/182  soft-tissue]
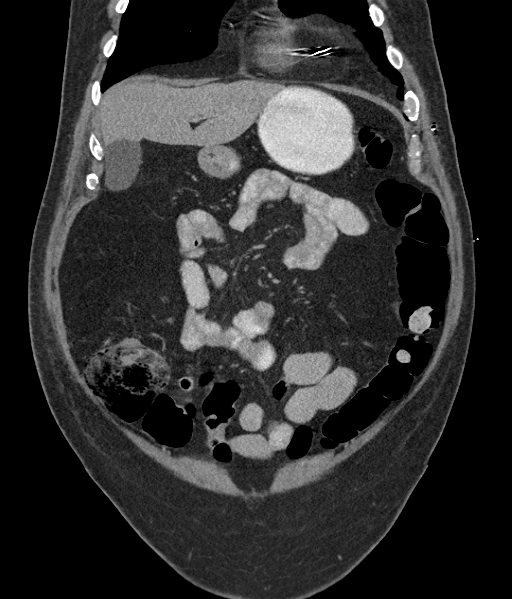
[im 81/182  soft-tissue]
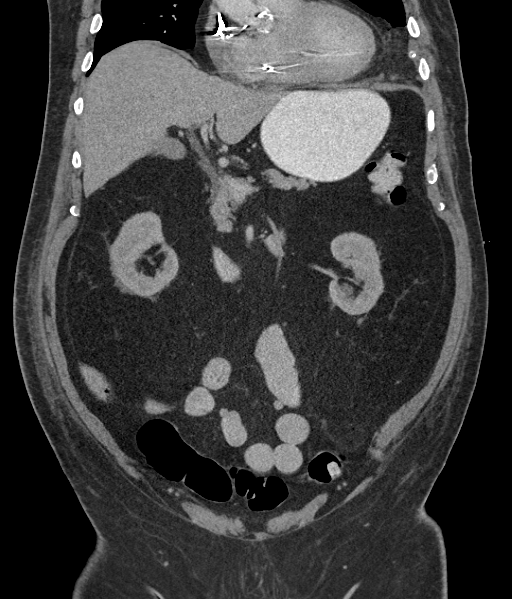
[im 101/182  soft-tissue]
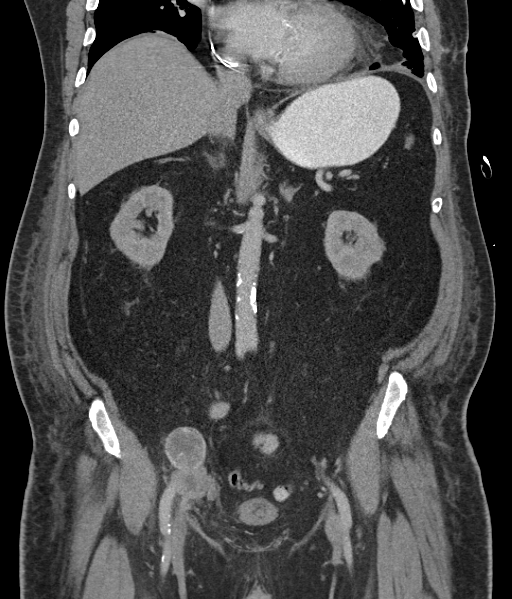

[16 of 46 positions shown; findings below may reference images not displayed]

FINDINGS: Lower chest: Interval removal of the large left pleural effusion.
There is a residual mass at the left lung base measuring 7.0 x
cm. This appears solid without air bronchograms. There is a chest
tube at the left lung base. There is a small pneumothorax anteriorly
in the left lower lobe. Within the right lower lobe, there is a
rounded nodule measuring 22 mm on image 9, series 3.

Hepatobiliary: No focal hepatic lesion.  The normal gallbladder.

Pancreas: Pancreas is normal. No ductal dilatation. No pancreatic
inflammation.

Spleen: Normal spleen.

Adrenals/urinary tract: Nodule of the left adrenal gland measuring
20 mm. Right adrenal glands normal.

Kidneys, ureters, and bladder normal. Foley catheter is within a
collapsed bladder.

Stomach/Bowel: Stomach, small bowel, appendix, cecum normal. The
colon and rectosigmoid colon are normal.

Vascular/Lymphatic: Abdominal aorta is normal caliber. No
retroperitoneal periportal lymphadenopathy.

In the pelvis there is multi lobular mass along the right iliac
vessels. This mass appears to locally invade right common iliac vein
(image 88, series 2). This multilobular mass measures approximately
7.3 x6.1 x 2.5 cm. There are multiple rounded components with low
attenuation centrally suggesting necrosis. This mass approaches the
inguinal canal and is likely palpable and would potentially be
medial biopsy.

Reproductive: Prostate gland is normal.  No pelvic free fluid.

Musculoskeletal: No aggressive osseous lesion.
IMPRESSION: 1. Large mass in the left lower lobe is concerning for malignancy.
2. Rounded nodule at the right lower lobe is concerning for
malignancy. Favor metastatic melanoma
3. Conglomerate round metastatic lesions along the right common
iliac vessels concerning for metastatic melanoma. Lesions appear to
invade the right common iliac vein.
4. Potential left adrenal gland metastasis.
5. Small left pneumothorax.

## 2016-10-04 IMAGING — CR DG CHEST 1V PORT
1 series · 1 of 1 positions shown · non-contrast
Comparison: 01/08/2014

CLINICAL DATA: Malignant pleural effusion.

EXAM:
PORTABLE CHEST - 1 VIEW

[AP]
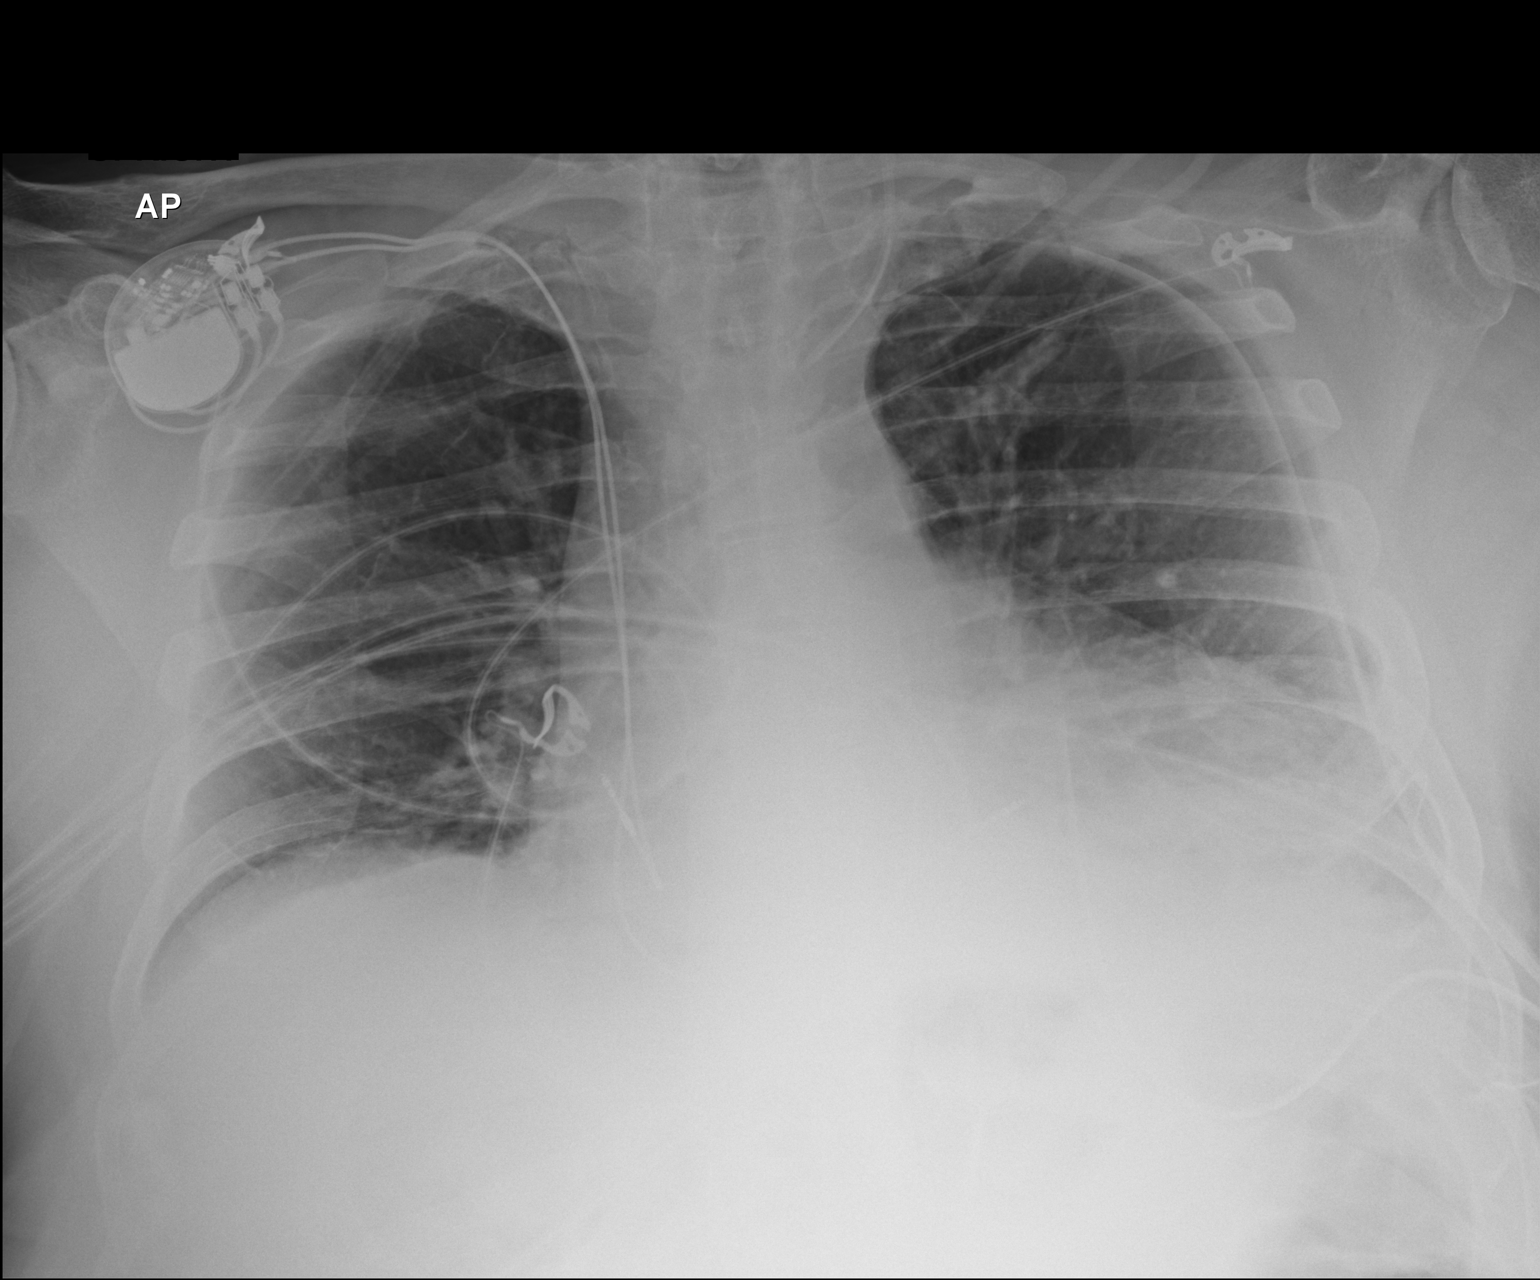

[1 of 1 positions shown; findings below may reference images not displayed]

FINDINGS: Low lung volumes are again noted.

Mild left lung base opacity likely atelectasis. Small effusion is
suspected.

No pulmonary edema.  No pneumothorax.

Cardiac silhouette is mildly enlarged.  Normal mediastinal contours.

Left-sided chest tubes and left internal jugular central venous line
are stable from the previous exam. Stable right anterior chest wall
sequential pacemaker.
IMPRESSION: 1. No change from the previous day's study.
2. Mild persistent left lung base opacity likely combination of
atelectasis with minimal residual pleural fluid.
3. Support apparatus is unchanged. No pulmonary edema. No
pneumothorax.
